# Patient Record
Sex: Female | Born: 1995
Health system: Southern US, Community
[De-identification: ages and names within clinical notes are randomized; demographics above are authoritative.]

## PROBLEM LIST (undated history)

## (undated) DIAGNOSIS — Z973 Presence of spectacles and contact lenses: Secondary | ICD-10-CM

## (undated) DIAGNOSIS — N920 Excessive and frequent menstruation with regular cycle: Secondary | ICD-10-CM

## (undated) HISTORY — PX: NO PAST SURGERIES: SHX2092

## (undated) HISTORY — DX: Presence of spectacles and contact lenses: Z97.3

## (undated) HISTORY — DX: Excessive and frequent menstruation with regular cycle: N92.0

---
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
## 2016-10-01 ENCOUNTER — Ambulatory Visit (INDEPENDENT_AMBULATORY_CARE_PROVIDER_SITE_OTHER): Payer: 59 | Admitting: Family Medicine

## 2016-10-01 ENCOUNTER — Encounter: Payer: Self-pay | Admitting: Family Medicine

## 2016-10-01 ENCOUNTER — Other Ambulatory Visit (HOSPITAL_COMMUNITY)
Admission: RE | Admit: 2016-10-01 | Discharge: 2016-10-01 | Disposition: A | Discharge status: Home / Self Care | Payer: 59 | Source: Ambulatory Visit | Attending: Family Medicine | Admitting: Family Medicine

## 2016-10-01 VITALS — BP 103/67 | HR 67 | Temp 98.4°F | Resp 20 | Ht 63.0 in | Wt 94.5 lb

## 2016-10-01 DIAGNOSIS — N920 Excessive and frequent menstruation with regular cycle: Secondary | ICD-10-CM | POA: Insufficient documentation

## 2016-10-01 DIAGNOSIS — Z0001 Encounter for general adult medical examination with abnormal findings: Secondary | ICD-10-CM | POA: Diagnosis not present

## 2016-10-01 DIAGNOSIS — Z113 Encounter for screening for infections with a predominantly sexual mode of transmission: Secondary | ICD-10-CM | POA: Insufficient documentation

## 2016-10-01 DIAGNOSIS — Z114 Encounter for screening for human immunodeficiency virus [HIV]: Secondary | ICD-10-CM | POA: Insufficient documentation

## 2016-10-01 DIAGNOSIS — Z130 Encounter for screening for diseases of the blood and blood-forming organs and certain disorders involving the immune mechanism: Secondary | ICD-10-CM | POA: Insufficient documentation

## 2016-10-01 DIAGNOSIS — Z681 Body mass index (BMI) 19 or less, adult: Secondary | ICD-10-CM

## 2016-10-01 DIAGNOSIS — Z131 Encounter for screening for diabetes mellitus: Secondary | ICD-10-CM | POA: Diagnosis not present

## 2016-10-01 LAB — CBC WITH DIFFERENTIAL/PLATELET
BASOS PCT: 1.3 % (ref 0.0–3.0)
Basophils Absolute: 0 10*3/uL (ref 0.0–0.1)
EOS ABS: 0 10*3/uL (ref 0.0–0.7)
Eosinophils Relative: 0.5 % (ref 0.0–5.0)
HCT: 40.2 % (ref 36.0–46.0)
HEMOGLOBIN: 12.8 g/dL (ref 12.0–15.0)
LYMPHS PCT: 40.3 % (ref 12.0–46.0)
Lymphs Abs: 1.5 10*3/uL (ref 0.7–4.0)
MCHC: 31.9 g/dL (ref 30.0–36.0)
MCV: 84.8 fl (ref 78.0–100.0)
Monocytes Absolute: 0.3 10*3/uL (ref 0.1–1.0)
Monocytes Relative: 6.8 % (ref 3.0–12.0)
NEUTROS ABS: 1.9 10*3/uL (ref 1.4–7.7)
Neutrophils Relative %: 51.1 % (ref 43.0–77.0)
Platelets: 233 10*3/uL (ref 150.0–400.0)
RBC: 4.73 Mil/uL (ref 3.87–5.11)
RDW: 13.6 % (ref 11.5–14.6)
WBC: 3.7 10*3/uL — ABNORMAL LOW (ref 4.5–10.5)

## 2016-10-01 LAB — COMPREHENSIVE METABOLIC PANEL
ALBUMIN: 4.9 g/dL (ref 3.5–5.2)
ALK PHOS: 53 U/L (ref 39–117)
ALT: 9 U/L (ref 0–35)
AST: 13 U/L (ref 0–37)
BILIRUBIN TOTAL: 0.8 mg/dL (ref 0.2–1.2)
BUN: 12 mg/dL (ref 6–23)
CO2: 27 mEq/L (ref 19–32)
Calcium: 9.8 mg/dL (ref 8.4–10.5)
Chloride: 102 mEq/L (ref 96–112)
Creatinine, Ser: 0.88 mg/dL (ref 0.40–1.20)
GFR: 105.29 mL/min (ref >60.00)
Glucose, Bld: 84 mg/dL (ref 70–99)
Potassium: 3.4 mEq/L — ABNORMAL LOW (ref 3.5–5.1)
SODIUM: 138 meq/L (ref 135–145)
TOTAL PROTEIN: 7.9 g/dL (ref 6.0–8.3)

## 2016-10-01 LAB — TSH: TSH: 1.06 u[IU]/mL (ref 0.35–5.50)

## 2016-10-01 NOTE — Progress Notes (Signed)
Patient ID: Lisa Frank, female  DOB: 09-02-1995, 21 y.o.   MRN: [id number] Patient Care Team    Relationship Specialty Notifications Start End  Ma Hillock, DO PCP - General Family Medicine  10/01/16     Chief Complaint  Patient presents with  . Establish Care  . Annual Exam    Subjective:  Lisa Frank is a 21 y.o.  female present for new patient establishment. All past medical history, surgical history, allergies, family history, immunizations, medications and social history were Obtained and entered in the electronic medical record today. All recent labs, ED visits and hospitalizations within the last year were reviewed.  Well woman exam: Patient is a 21 year old African-American female present for well woman exam today. She reports cycles that occur monthly, last 7 days are heavy throughout all 7 days. She reports she has to change her pad/tampon every 2 hours. She denies dizziness or fatigue. She reports she's not sexually active. She is not on any form of birth control. She denies vaginal irritation, discharge or dyspareunia.Patient's last menstrual period was 10/01/2016.  Health maintenance:  Colonoscopy: No Fhx  Mammogram: start screen at 35-40, FHX Cervical cancer screening: Start screen at 21 Immunizations: tdap UTD 2016, Influenza (encouraged yearly) Infectious disease screening: HIV completed today.  DEXA: N/A Assistive device: None Oxygen use: None Patient has a Dental home. Hospitalizations/ED visits: reviewed if available.   Depression screen [facility] 2/9 10/01/2016  Decreased Interest 0  Down, Depressed, Hopeless 0  PHQ - 2 Score 0   No flowsheet data found.    Current Exercise Habits: The patient does not participate in regular exercise at present Exercise limited by: None identified No flowsheet data found.   Immunization History  Administered Date(s) Administered  . Tdap 04/08/2014    No exam data present  Past Medical History:    Diagnosis Date  . Menorrhagia   . Wears glasses    No Known Allergies Past Surgical History:  Procedure Laterality Date  . NO PAST SURGERIES     Family History  Problem Relation Age of Onset  . Breast cancer Maternal Aunt 37  . HIV/AIDS Paternal Aunt   . Alcohol abuse Paternal Uncle   . Breast cancer Maternal Grandmother 60   Social History   Social History  . Marital status: Single    Spouse name: N/A  . Number of children: N/A  . Years of education: 61   Occupational History  . student    Social History Main Topics  . Smoking status: Never Smoker  . Smokeless tobacco: Never Used  . Alcohol use No  . Drug use: No  . Sexual activity: No   Other Topics Concern  . Not on file   Social History Narrative   Single. Electronics engineer.   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. she lives off campus with her parents.   Drinks caffeine   wears her seatbelt, smoke detector in the home   Feels safe in her relationships.      Allergies as of 10/01/2016   No Known Allergies     Medication List    as of 10/01/2016  1:46 PM   You have not been prescribed any medications.     All past medical history, surgical history, allergies, family history, immunizations andmedications were updated in the EMR today and reviewed under the history and medication portions of their EMR.    No results found for this or any previous visit (from the  past 2160 hour(s)).   ROS: 14 pt review of systems performed and negative (unless mentioned in an HPI)  Objective: BP 103/67 (BP Location: Right Arm, Patient Position: Sitting, Cuff Size: Small)   Pulse 67   Temp 98.4 F (36.9 C)   Resp 20   Ht '5\' 3"'  (1.6 m)   Wt 94 lb 8 oz (42.9 kg)   LMP 10/01/2016   SpO2 100%   BMI 16.74 kg/m  Gen: Afebrile. No acute distress. Nontoxic in appearance, well-developed, well-nourished,  Very pleasant African-American female, looks younger than stated age. Thin. HENT: AT. [place]. Bilateral TM visualized and  normal in appearance, normal external auditory canal. MMM, no oral lesions, adequate dentition. Bilateral nares within normal limits. Throat without erythema, ulcerations or exudates. No Cough on exam, no hoarseness on exam. Eyes:Pupils Equal Round Reactive to light, Extraocular movements intact,  Conjunctiva without redness, discharge or icterus. Neck/lymp/endocrine: Supple, no lymphadenopathy, no thyromegaly CV: RRR no murmur, no edema, +2/4 P posterior tibialis pulses. No carotid bruits. No JVD. Chest: CTAB, no wheeze, rhonchi or crackles. Normal Respiratory effort. Good Air movement. Abd: Soft. Flat. NTND. BS present. No Masses palpated. No hepatosplenomegaly. No rebound tenderness or guarding. Skin: No rashes, purpura or petechiae. Warm and well-perfused. Skin intact. Neuro/Msk:  Normal gait. PERLA. EOMi. Alert. Oriented x3.  Cranial nerves II through XII intact. Muscle strength 5/5 upper/lower extremity. DTRs equal bilaterally. Psych: Normal affect, dress and demeanor. Normal speech. Normal thought content and judgment.   Assessment/plan: Lisa Frank is a 21 y.o. female present for est/CPE Screening for iron deficiency anemia - Patient with menorrhagia history - CBC w/Diff Encounter for general adult medical examination with abnormal findings Patient was encouraged to exercise greater than 150 minutes a week. Patient was encouraged to choose a diet filled with fresh fruits and vegetables, and lean meats. AVS provided to patient today for education/recommendation on gender specific health and safety maintenance. - Comp Met (CMET) - Up-to-date with all screenings and immunizations after today. Cervical cancer screening will start next year at age 89. - Encouraged yearly flu shot. Screen for STD (sexually transmitted disease) - Urine cytology ancillary only BMI less than 19,adult - Patient states she's always been very small. Her BMI today is 16. - TSH Encounter for screening for  HIV - Patient agreeable to testing today - HIV antibody (with reflex) Menorrhagia with regular cycle - could consider BCP if she desires.  - TSH and CBC   Return in about 1 year (around 10/01/2017) for CPE.   Note is dictated utilizing voice recognition software. Although note has been proof read prior to signing, occasional typographical errors still can be missed. If any questions arise, please do not hesitate to call for verification.  Electronically signed by: Howard Pouch, DO [place]

## 2016-10-01 NOTE — Patient Instructions (Signed)
Health Maintenance, Female Adopting a healthy lifestyle and getting preventive care can go a long way to promote health and wellness. Talk with your health care provider about what schedule of regular examinations is right for you. This is a good chance for you to check in with your provider about disease prevention and staying healthy. In between checkups, there are plenty of things you can do on your own. Experts have done a lot of research about which lifestyle changes and preventive measures are most likely to keep you healthy. Ask your health care provider for more information. Weight and diet Eat a healthy diet  Be sure to include plenty of vegetables, fruits, low-fat dairy products, and lean protein.  Do not eat a lot of foods high in solid fats, added sugars, or salt.  Get regular exercise. This is one of the most important things you can do for your health. ? Most adults should exercise for at least 150 minutes each week. The exercise should increase your heart rate and make you sweat (moderate-intensity exercise). ? Most adults should also do strengthening exercises at least twice a week. This is in addition to the moderate-intensity exercise.  Maintain a healthy weight  Body mass index (BMI) is a measurement that can be used to identify possible weight problems. It estimates body fat based on height and weight. Your health care provider can help determine your BMI and help you achieve or maintain a healthy weight.  For females 20 years of age and older: ? A BMI below 18.5 is considered underweight. ? A BMI of 18.5 to 24.9 is normal. ? A BMI of 25 to 29.9 is considered overweight. ? A BMI of 30 and above is considered obese.  Watch levels of cholesterol and blood lipids  You should start having your blood tested for lipids and cholesterol at 20 years of age, then have this test every 5 years.  You may need to have your cholesterol levels checked more often if: ? Your lipid or  cholesterol levels are high. ? You are older than 20 years of age. ? You are at high risk for heart disease.  Cancer screening Lung Cancer  Lung cancer screening is recommended for adults 55-80 years old who are at high risk for lung cancer because of a history of smoking.  A yearly low-dose CT scan of the lungs is recommended for people who: ? Currently smoke. ? Have quit within the past 15 years. ? Have at least a 30-pack-year history of smoking. A pack year is smoking an average of one pack of cigarettes a day for 1 year.  Yearly screening should continue until it has been 15 years since you quit.  Yearly screening should stop if you develop a health problem that would prevent you from having lung cancer treatment.  Breast Cancer  Practice breast self-awareness. This means understanding how your breasts normally appear and feel.  It also means doing regular breast self-exams. Let your health care provider know about any changes, no matter how small.  If you are in your 20s or 30s, you should have a clinical breast exam (CBE) by a health care provider every 1-3 years as part of a regular health exam.  If you are 40 or older, have a CBE every year. Also consider having a breast X-ray (mammogram) every year.  If you have a family history of breast cancer, talk to your health care provider about genetic screening.  If you are at high risk   for breast cancer, talk to your health care provider about having an MRI and a mammogram every year.  Breast cancer gene (BRCA) assessment is recommended for women who have family members with BRCA-related cancers. BRCA-related cancers include: ? Breast. ? Ovarian. ? Tubal. ? Peritoneal cancers.  Results of the assessment will determine the need for genetic counseling and BRCA1 and BRCA2 testing.  Cervical Cancer Your health care provider may recommend that you be screened regularly for cancer of the pelvic organs (ovaries, uterus, and  vagina). This screening involves a pelvic examination, including checking for microscopic changes to the surface of your cervix (Pap test). You may be encouraged to have this screening done every 3 years, beginning at age 22.  For women ages 56-65, health care providers may recommend pelvic exams and Pap testing every 3 years, or they may recommend the Pap and pelvic exam, combined with testing for human papilloma virus (HPV), every 5 years. Some types of HPV increase your risk of cervical cancer. Testing for HPV may also be done on women of any age with unclear Pap test results.  Other health care providers may not recommend any screening for nonpregnant women who are considered low risk for pelvic cancer and who do not have symptoms. Ask your health care provider if a screening pelvic exam is right for you.  If you have had past treatment for cervical cancer or a condition that could lead to cancer, you need Pap tests and screening for cancer for at least 20 years after your treatment. If Pap tests have been discontinued, your risk factors (such as having a new sexual partner) need to be reassessed to determine if screening should resume. Some women have medical problems that increase the chance of getting cervical cancer. In these cases, your health care provider may recommend more frequent screening and Pap tests.  Colorectal Cancer  This type of cancer can be detected and often prevented.  Routine colorectal cancer screening usually begins at 20 years of age and continues through 20 years of age.  Your health care provider may recommend screening at an earlier age if you have risk factors for colon cancer.  Your health care provider may also recommend using home test kits to check for hidden blood in the stool.  A small camera at the end of a tube can be used to examine your colon directly (sigmoidoscopy or colonoscopy). This is done to check for the earliest forms of colorectal  cancer.  Routine screening usually begins at age 33.  Direct examination of the colon should be repeated every 5-10 years through 20 years of age. However, you may need to be screened more often if early forms of precancerous polyps or small growths are found.  Skin Cancer  Check your skin from head to toe regularly.  Tell your health care provider about any new moles or changes in moles, especially if there is a change in a mole's shape or color.  Also tell your health care provider if you have a mole that is larger than the size of a pencil eraser.  Always use sunscreen. Apply sunscreen liberally and repeatedly throughout the day.  Protect yourself by wearing long sleeves, pants, a wide-brimmed hat, and sunglasses whenever you are outside.  Heart disease, diabetes, and high blood pressure  High blood pressure causes heart disease and increases the risk of stroke. High blood pressure is more likely to develop in: ? People who have blood pressure in the high end of  the normal range (130-139/85-89 mm Hg). ? People who are overweight or obese. ? People who are African American.  If you are 21-29 years of age, have your blood pressure checked every 3-5 years. If you are 3 years of age or older, have your blood pressure checked every year. You should have your blood pressure measured twice-once when you are at a hospital or clinic, and once when you are not at a hospital or clinic. Record the average of the two measurements. To check your blood pressure when you are not at a hospital or clinic, you can use: ? An automated blood pressure machine at a pharmacy. ? A home blood pressure monitor.  If you are between 17 years and 37 years old, ask your health care provider if you should take aspirin to prevent strokes.  Have regular diabetes screenings. This involves taking a blood sample to check your fasting blood sugar level. ? If you are at a normal weight and have a low risk for diabetes,  have this test once every three years after 20 years of age. ? If you are overweight and have a high risk for diabetes, consider being tested at a younger age or more often. Preventing infection Hepatitis B  If you have a higher risk for hepatitis B, you should be screened for this virus. You are considered at high risk for hepatitis B if: ? You were born in a country where hepatitis B is common. Ask your health care provider which countries are considered high risk. ? Your parents were born in a high-risk country, and you have not been immunized against hepatitis B (hepatitis B vaccine). ? You have HIV or AIDS. ? You use needles to inject street drugs. ? You live with someone who has hepatitis B. ? You have had sex with someone who has hepatitis B. ? You get hemodialysis treatment. ? You take certain medicines for conditions, including cancer, organ transplantation, and autoimmune conditions.  Hepatitis C  Blood testing is recommended for: ? Everyone born from [birth year] through 1964. ? Anyone with known risk factors for hepatitis C.  Sexually transmitted infections (STIs)  You should be screened for sexually transmitted infections (STIs) including gonorrhea and chlamydia if: ? You are sexually active and are younger than 20 years of age. ? You are older than 20 years of age and your health care provider tells you that you are at risk for this type of infection. ? Your sexual activity has changed since you were last screened and you are at an increased risk for chlamydia or gonorrhea. Ask your health care provider if you are at risk.  If you do not have HIV, but are at risk, it may be recommended that you take a prescription medicine daily to prevent HIV infection. This is called pre-exposure prophylaxis (PrEP). You are considered at risk if: ? You are sexually active and do not regularly use condoms or know the HIV status of your partner(s). ? You take drugs by injection. ? You are  sexually active with a partner who has HIV.  Talk with your health care provider about whether you are at high risk of being infected with HIV. If you choose to begin PrEP, you should first be tested for HIV. You should then be tested every 3 months for as long as you are taking PrEP. Pregnancy  If you are premenopausal and you may become pregnant, ask your health care provider about preconception counseling.  If you may become  pregnant, take 400 to 800 micrograms (mcg) of folic acid every day.  If you want to prevent pregnancy, talk to your health care provider about birth control (contraception). Osteoporosis and menopause  Osteoporosis is a disease in which the bones lose minerals and strength with aging. This can result in serious bone fractures. Your risk for osteoporosis can be identified using a bone density scan.  If you are 27 years of age or older, or if you are at risk for osteoporosis and fractures, ask your health care provider if you should be screened.  Ask your health care provider whether you should take a calcium or vitamin D supplement to lower your risk for osteoporosis.  Menopause may have certain physical symptoms and risks.  Hormone replacement therapy may reduce some of these symptoms and risks. Talk to your health care provider about whether hormone replacement therapy is right for you. Follow these instructions at home:  Schedule regular health, dental, and eye exams.  Stay current with your immunizations.  Do not use any tobacco products including cigarettes, chewing tobacco, or electronic cigarettes.  If you are pregnant, do not drink alcohol.  If you are breastfeeding, limit how much and how often you drink alcohol.  Limit alcohol intake to no more than 1 drink per day for nonpregnant women. One drink equals 12 ounces of beer, 5 ounces of wine, or 1 ounces of hard liquor.  Do not use street drugs.  Do not share needles.  Ask your health care  provider for help if you need support or information about quitting drugs.  Tell your health care provider if you often feel depressed.  Tell your health care provider if you have ever been abused or do not feel safe at home. This information is not intended to replace advice given to you by your health care provider. Make sure you discuss any questions you have with your health care provider. Document Released: 09/07/2010 Document Revised: 07/31/2015 Document Reviewed: 11/26/2014 Elsevier Interactive Patient Education  2018 [employer].   Please help [place] help you:  We are honored you have chosen [facility] for your Primary Care home. Below you will find basic instructions that you may need to access in the future. Please help [place] help you by reading the instructions, which cover many of the frequent questions we experience.   Prescription refills and request:  -In order to allow more efficient response time, please call your pharmacy for all refills. They will forward the request electronically to [place]. This allows for the quickest possible response. Request left on a nurse line can take longer to refill, since these are checked as time allows between office patients and other phone calls.  - refill request can take up to 3-5 working days to complete.  - If request is sent electronically and request is appropiate, it is usually completed in 1-2 business days.  - all patients will need to be seen routinely for all chronic medical conditions requiring prescription medications (see follow-up below). If you are overdue for follow up on your condition, you will be asked to make an appointment and we will call in enough medication to cover you until your appointment (up to 30 days).  - all controlled substances will require a face to face visit to request/refill.  - if you desire your prescriptions to go through a new pharmacy, and have an active script at original pharmacy, you will need to call  your pharmacy and have scripts transferred to  new pharmacy. This is completed between the pharmacy locations and not by your provider.    Results: If any images or labs were ordered, it can take up to 1 week to get results depending on the test ordered and the lab/facility running and resulting the test. - Normal or stable results, which do not need further discussion, may be released to your mychart immediately with attached note to you. A call may not be generated for normal results. Please make certain to sign up for mychart. If you have questions on how to activate your mychart you can call the front office.  - If your results need further discussion, our office will attempt to contact you via phone, and if unable to reach you after 2 attempts, we will release your abnormal result to your mychart with instructions.  - All results will be automatically released in mychart after 1 week.  - Your provider will provide you with explanation and instruction on all relevant material in your results. Please keep in mind, results and labs may appear confusing or abnormal to the untrained eye, but it does not mean they are actually abnormal for you personally. If you have any questions about your results that are not covered, or you desire more detailed explanation than what was provided, you should make an appointment with your provider to do so.   Our office handles many outgoing and incoming calls daily. If we have not contacted you within 1 week about your results, please check your mychart to see if there is a message first and if not, then contact our office.  In helping with this matter, you help decrease call volume, and therefore allow [place] to be able to respond to patients needs more efficiently.   Acute office visits (sick visit):  An acute visit is intended for a new problem and are scheduled in shorter time slots to allow schedule openings for patients with new problems. This is the appropriate visit  to discuss a new problem. In order to provide you with excellent quality medical care with proper time for you to explain your problem, have an exam and receive treatment with instructions, these appointments should be limited to one new problem per visit. If you experience a new problem, in which you desire to be addressed, please make an acute office visit, we save openings on the schedule to accommodate you. Please do not save your new problem for any other type of visit, let us take care of it properly and quickly for you.   Follow up visits:  Depending on your condition(s) your provider will need to see you routinely in order to provide you with quality care and prescribe medication(s). Most chronic conditions (Example: hypertension, Diabetes, depression/anxiety... etc), require visits a couple times a year. Your provider will instruct you on proper follow up for your personal medical conditions and history. Please make certain to make follow up appointments for your condition as instructed. Failing to do so could result in lapse in your medication treatment/refills. If you request a refill, and are overdue to be seen on a condition, we will always provide you with a 30 day script (once) to allow you time to schedule.    Medicare wellness (well visit): - we have a wonderful Nurse Maudie Mercury), that will meet with you and provide you will yearly medicare wellness visits. These visits should occur yearly (can not be scheduled less than 1 calendar year apart) and cover preventive health, immunizations, advance directives and  screenings you are entitled to yearly through your medicare benefits. Do not miss out on your entitled benefits, this is when medicare will pay for these benefits to be ordered for you.  These are strongly encouraged by your provider and is the appropriate type of visit to make certain you are up to date with all preventive health benefits. If you have not had your medicare wellness exam in  the last 12 months, please make certain to schedule one by calling the office and schedule your medicare wellness with Maudie Mercury as soon as possible.   Yearly physical (well visit):  - Adults are recommended to be seen yearly for physicals. Check with your insurance and date of your last physical, most insurances require one calendar year between physicals. Physicals include all preventive health topics, screenings, medical exam and labs that are appropriate for gender/age and history. You may have fasting labs needed at this visit. This is a well visit (not a sick visit), new problems should not be covered during this visit (see acute visit).  - Pediatric patients are seen more frequently when they are younger. Your provider will advise you on well child visit timing that is appropriate for your their age. - This is not a medicare wellness visit. Medicare wellness exams do not have an exam portion to the visit. Some medicare companies allow for a physical, some do not allow a yearly physical. If your medicare allows a yearly physical you can schedule the medicare wellness with our nurse Maudie Mercury and have your physical with your provider after, on the same day. Please check with insurance for your full benefits.   Late Policy/No Shows:  - all new patients should arrive 15-30 minutes earlier than appointment to allow [place] time  to  obtain all personal demographics,  insurance information and for you to complete office paperwork. - All established patients should arrive 10-15 minutes earlier than appointment time to update all information and be checked in .  - In our best efforts to run on time, if you are late for your appointment you will be asked to either reschedule or if able, we will work you back into the schedule. There will be a wait time to work you back in the schedule,  depending on availability.  - If you are unable to make it to your appointment as scheduled, please call 24 hours ahead of time to allow [place]  to fill the time slot with someone else who needs to be seen. If you do not cancel your appointment ahead of time, you may be charged a no show fee.

## 2016-10-02 LAB — HIV ANTIBODY (ROUTINE TESTING W REFLEX): HIV: NONREACTIVE

## 2016-10-04 LAB — URINE CYTOLOGY ANCILLARY ONLY
CHLAMYDIA, DNA PROBE: NEGATIVE
NEISSERIA GONORRHEA: NEGATIVE

## 2016-10-05 ENCOUNTER — Telehealth: Payer: Self-pay | Admitting: *Deleted

## 2016-10-05 NOTE — Telephone Encounter (Signed)
Patient called and is inquiring about her lab results. Patient is requesting a call back. Her contact number is [phone]. Please advise. Thank you

## 2016-10-05 NOTE — Telephone Encounter (Signed)
Patient notified that PCP is out of the office this week and that our other physician would contact patient if labs were abnormal.  Dr. Claiborne BillingsKuneff will go over labs when she returns and we will call her as soon as results are released.

## 2016-10-11 ENCOUNTER — Telehealth: Payer: Self-pay | Admitting: Family Medicine

## 2016-10-11 ENCOUNTER — Ambulatory Visit (INDEPENDENT_AMBULATORY_CARE_PROVIDER_SITE_OTHER): Payer: 59 | Admitting: Family Medicine

## 2016-10-11 ENCOUNTER — Encounter: Payer: Self-pay | Admitting: Family Medicine

## 2016-10-11 VITALS — BP 105/69 | HR 72 | Temp 98.3°F | Resp 18 | Ht 63.0 in | Wt 97.8 lb

## 2016-10-11 DIAGNOSIS — Z681 Body mass index (BMI) 19 or less, adult: Secondary | ICD-10-CM | POA: Diagnosis not present

## 2016-10-11 DIAGNOSIS — R627 Adult failure to thrive: Secondary | ICD-10-CM | POA: Diagnosis not present

## 2016-10-11 NOTE — Patient Instructions (Signed)
Goal is 3000- 3300 calories a day.  Try to break up meals into 5 smaller meals (which can be high calorie boost or carnation breakfast)  High calorie protein, meats, nuts, olive oil for meals and snacks.    Calorie calculator .net --> weigh once a week, and put new numbers into calculator. --> change calorie goal by new suggestions.     High-Protein and High-Calorie Diet Eating high-protein and high-calorie foods can help you to gain weight, heal after an injury, and recover after an illness or surgery. What is my plan? The specific amount of daily protein and calories you need depends on:  Your body weight.  The reason this diet is recommended for you.  Generally, a high-protein, high-calorie diet involves:  Eating 250-500 extra calories each day.  Making sure that 10-35% of your daily calories come from protein.  Talk to your health care provider about how much protein and how many calories you need each day. Follow the diet as directed by your health care provider. What do I need to know about this diet?  Ask your health care provider if you should take a nutritional supplement.  Try to eat six small meals each day instead of three large meals.  Eat a balanced diet, including one food that is high in protein at each meal.  Keep nutritious snacks handy, such as nuts, trail mixes, dried fruit, and yogurt.  If you have kidney disease or diabetes, eating too much protein may put extra stress on your kidneys. Talk to your health care provider if you have either of those conditions. What are some high-protein foods? Grains Quinoa. Bulgur wheat. Vegetables Soybeans. Peas. Meats and Other Protein Sources Beef, pork, and poultry. Fish and seafood. Eggs. Tofu. Textured vegetable protein (TVP). Peanut butter. Nuts and seeds. Dried beans. Protein powders. Dairy Whole milk. Whole-milk yogurt. Powdered milk. Cheese. [employer]. Eggnog. Beverages High-protein supplement drinks.  Soy milk. Other Protein bars. The items listed above may not be a complete list of recommended foods or beverages. Contact your dietitian for more options. What are some high-calorie foods? Grains Pasta. Quick breads. Muffins. Pancakes. Ready-to-eat cereal. Vegetables Vegetables cooked in oil or butter. Fried potatoes. Fruits Dried fruit. Fruit leather. Canned fruit in syrup. Fruit juice. Avocados. Meats and Other Protein Sources Peanut butter. Nuts and seeds. Dairy Heavy cream. Whipped cream. Cream cheese. Sour cream. Ice cream. Custard. Pudding. Beverages Meal-replacement beverages. Nutrition shakes. Fruit juice. Sugar-sweetened soft drinks. Condiments Salad dressing. Mayonnaise. Alfredo sauce. Fruit preserves or jelly. Honey. Syrup. Sweets/Desserts Cake. Cookies. Pie. Pastries. Candy bars. Chocolate. Fats and Oils Butter or margarine. Oil. Gravy. Other Meal-replacement bars. The items listed above may not be a complete list of recommended foods or beverages. Contact your dietitian for more options. What are some tips for including high-protein and high-calorie foods in my diet?  Add whole milk, half-and-half, or heavy cream to cereal, pudding, soup, or hot cocoa.  Add whole milk to instant breakfast drinks.  Add peanut butter to oatmeal or smoothies.  Add powdered milk to baked goods, smoothies, or milkshakes.  Add powdered milk, cream, or butter to mashed potatoes.  Add cheese to cooked vegetables.  Make whole-milk yogurt parfaits. Top them with granola, fruit, or nuts.  Add cottage cheese to your fruit.  Add avocados, cheese, or both to sandwiches or salads.  Add meat, poultry, or seafood to rice, pasta, casseroles, salads, and soups.  Use mayonnaise when making egg salad, chicken salad, or tuna salad.  Use  peanut butter as a topping for pretzels, celery, or crackers.  Add beans to casseroles, dips, and spreads.  Add pureed beans to sauces and  soups.  Replace calorie-free drinks with calorie-containing drinks, such as milk and fruit juice. This information is not intended to replace advice given to you by your health care provider. Make sure you discuss any questions you have with your health care provider. Document Released: 02/22/2005 Document Revised: 07/31/2015 Document Reviewed: 08/07/2013 Elsevier Interactive Patient Education  [employer]2018 Elsevier Inc.

## 2016-10-11 NOTE — Progress Notes (Signed)
Lisa Frank , 05/31/1995, 21 y.o., female MRN: [id number] Patient Care Team    Relationship Specialty Notifications Start End  Natalia LeatherwoodKuneff, Renee A, DO PCP - General Family Medicine  10/01/16     Chief Complaint  Patient presents with  . weight management    patient wants to gain weight     Subjective: Pt presents for an OV with complaints of Inability to gain weight. She states she's always been small, and no matter what she has tried she cannot gain weight. She states she has tried tracking her calories trying to take in 2000 cal a day, but was only getting 1700 cal a day. She reports she just gets too full and does not want to eat. She eats one large meal a day, and have snacks consisting of chips or candy. She does not have a formal exercise regimen. She reports she walks a great deal. She had a recent physical with labs, that were basically unremarkable with the exception of just barely mildly low potassium, CBC, CMP and TSH normal. HIV, gonorrhea and Chlamydia negative. She denies any weight loss, night sweats, fever, chills or change in bowel habits.  Depression screen PHQ 2/9 10/01/2016  Decreased Interest 0  Down, Depressed, Hopeless 0  PHQ - 2 Score 0    No Known Allergies Social History  Substance Use Topics  . Smoking status: Never Smoker  . Smokeless tobacco: Never Used  . Alcohol use No   Past Medical History:  Diagnosis Date  . Menorrhagia   . Wears glasses    Past Surgical History:  Procedure Laterality Date  . NO PAST SURGERIES     Family History  Problem Relation Age of Onset  . Breast cancer Maternal Aunt 50  . HIV/AIDS Paternal Aunt   . Alcohol abuse Paternal Uncle   . Breast cancer Maternal Grandmother 60   Allergies as of 10/11/2016   No Known Allergies     Medication List    as of 10/11/2016  3:23 PM   You have not been prescribed any medications.     All past medical history, surgical history, allergies, family history, immunizations  andmedications were updated in the EMR today and reviewed under the history and medication portions of their EMR.     ROS: Negative, with the exception of above mentioned in HPI   Objective:  BP 105/69 (BP Location: Left Arm, Patient Position: Sitting, Cuff Size: Small)   Pulse 72   Temp 98.3 F (36.8 C)   Resp 18   Ht 5\' 3"  (1.6 m)   Wt 97 lb 12.8 oz (44.4 kg)   LMP 10/01/2016   SpO2 99%   BMI 17.32 kg/m  Body mass index is 17.32 kg/m. Gen: Afebrile. No acute distress. Nontoxic in appearance, well developed, well nourished. African-American female. Small frame. HENT: AT. [place]. MMM Eyes:Pupils Equal Round Reactive to light, Extraocular movements intact,  Conjunctiva without redness, discharge or icterus. CV: RRR  Chest: CTAB, no wheeze or crackles.  Abd: Soft.  NTND. BS present Skin: No rashes, purpura or petechiae.  Psych: Normal affect, dress and demeanor. Normal speech. Normal thought content and judgment.  No exam data present No results found. No results found for this or any previous visit (from the past 24 hour(s)).  Assessment/Plan: Lisa Frank is a 21 y.o. female present for OV for  BMI less than 19,adult/poor weight gain - Patient is a small framed healthy African-American female. She appears healthy and fit.  Normal thyroid function, no signs of malabsorption. She does desire to gain some weight, and we discussed healthy BMI and healthy weight gain in great detail today. - She already has a application on her phone that will help her count her calories. She was provided with the website to a calorie calculate her to help guide her through the process on how many calories she should consume in a day to gain weight in a healthy manner. We decided on a goal of approximately 3000 cal a day, and she was given aVF education and instruction on how to obtain healthy calories. - She currently is eating 1 large meal a day, I encouraged her to eat 3-5 smaller meals a day.  Supplement between meals if necessary with high calorie boost. She is worried about having the time when she returns to classes to eat this many times a day, the high calorie boost or [employer] would be ideal since she can take this to class with her. Discussed using healthy lean meats and increase proteins for the extra calories. AVS education surrounding different types of food that is ideal for this was provided to her. - We discussed follow-up, if she would like she can follow-up in about 4 weeks to see how she is doing if she would like.   Reviewed expectations re: course of current medical issues.  Discussed self-management of symptoms.  Outlined signs and symptoms indicating need for more acute intervention.  Patient verbalized understanding and all questions were answered.  Patient received an After-Visit Summary.    No orders of the defined types were placed in this encounter.  > 25 minutes spent with patient, >50% of time spent face to face counseling    Note is dictated utilizing voice recognition software. Although note has been proof read prior to signing, occasional typographical errors still can be missed. If any questions arise, please do not hesitate to call for verification.   electronically signed by:  Felix Pacini, DO  [facility] [place]

## 2016-10-11 NOTE — Telephone Encounter (Signed)
Please call pt: - all labs are normal. - her potassium is mildly low, she could benefit from taking a daily multivitamin and/or increase potassium rich foods.

## 2016-10-11 NOTE — Telephone Encounter (Signed)
Detailed message left on voice mail. Okay per DPR. 

## 2016-10-14 DIAGNOSIS — R627 Adult failure to thrive: Secondary | ICD-10-CM | POA: Insufficient documentation

## 2017-10-05 ENCOUNTER — Encounter: Payer: Self-pay | Admitting: Family Medicine

## 2017-10-05 ENCOUNTER — Ambulatory Visit (INDEPENDENT_AMBULATORY_CARE_PROVIDER_SITE_OTHER): Payer: 59 | Admitting: Family Medicine

## 2017-10-05 ENCOUNTER — Other Ambulatory Visit (HOSPITAL_COMMUNITY)
Admission: RE | Admit: 2017-10-05 | Discharge: 2017-10-05 | Disposition: A | Discharge status: Home / Self Care | Payer: 59 | Source: Ambulatory Visit | Attending: Family Medicine | Admitting: Family Medicine

## 2017-10-05 VITALS — BP 101/67 | HR 75 | Temp 98.4°F | Resp 20 | Ht 63.0 in | Wt 97.6 lb

## 2017-10-05 DIAGNOSIS — Z0001 Encounter for general adult medical examination with abnormal findings: Secondary | ICD-10-CM | POA: Diagnosis not present

## 2017-10-05 DIAGNOSIS — Z130 Encounter for screening for diseases of the blood and blood-forming organs and certain disorders involving the immune mechanism: Secondary | ICD-10-CM | POA: Diagnosis not present

## 2017-10-05 DIAGNOSIS — Z01419 Encounter for gynecological examination (general) (routine) without abnormal findings: Secondary | ICD-10-CM | POA: Diagnosis not present

## 2017-10-05 DIAGNOSIS — Z114 Encounter for screening for human immunodeficiency virus [HIV]: Secondary | ICD-10-CM

## 2017-10-05 DIAGNOSIS — Z30011 Encounter for initial prescription of contraceptive pills: Secondary | ICD-10-CM

## 2017-10-05 DIAGNOSIS — Z681 Body mass index (BMI) 19 or less, adult: Secondary | ICD-10-CM | POA: Diagnosis not present

## 2017-10-05 LAB — CBC WITH DIFFERENTIAL/PLATELET
Basophils Absolute: 0 10*3/uL (ref 0.0–0.1)
Basophils Relative: 1.1 % (ref 0.0–3.0)
EOS PCT: 1 % (ref 0.0–5.0)
Eosinophils Absolute: 0 10*3/uL (ref 0.0–0.7)
HCT: 38.3 % (ref 36.0–46.0)
Hemoglobin: 12.5 g/dL (ref 12.0–15.0)
Lymphocytes Relative: 41.7 % (ref 12.0–46.0)
Lymphs Abs: 1.9 10*3/uL (ref 0.7–4.0)
MCHC: 32.6 g/dL (ref 30.0–36.0)
MCV: 83.8 fl (ref 78.0–100.0)
MONO ABS: 0.4 10*3/uL (ref 0.1–1.0)
Monocytes Relative: 7.8 % (ref 3.0–12.0)
Neutro Abs: 2.2 10*3/uL (ref 1.4–7.7)
Neutrophils Relative %: 48.4 % (ref 43.0–77.0)
PLATELETS: 250 10*3/uL (ref 150.0–400.0)
RBC: 4.57 Mil/uL (ref 3.87–5.11)
RDW: 13.9 % (ref 11.5–15.5)
WBC: 4.5 10*3/uL (ref 4.0–10.5)

## 2017-10-05 MED ORDER — NORGESTIMATE-ETH ESTRADIOL 0.25-35 MG-MCG PO TABS: 1.0000 | ORAL_TABLET | Freq: Every day | ORAL | 11 refills | Status: DC

## 2017-10-05 NOTE — Progress Notes (Signed)
Patient ID: Lisa Frank, female  DOB: 04/12/1995, 22 y.o.   MRN: [id number] Patient Care Team    Relationship Specialty Notifications Start End  Natalia LeatherwoodKuneff, Jahziel Sinn A, DO PCP - General Family Medicine  10/01/16     Chief Complaint  Patient presents with  . Annual Exam  . Gynecologic Exam    Subjective:  Lisa Frank is a 22 y.o.  Female  present for CPE w/ PAP. All past medical history, surgical history, allergies, family history, immunizations, medications and social history were updated in the electronic medical record today. All recent labs, ED visits and hospitalizations within the last year were reviewed.  Well woman exam: Patient is a 22 year old African-American female present for well woman exam today. She reports cycles are ~28-31 days, last 7 days and are not as heavy as before, but steady flow all 7 days. She has been sexually active, but not at this time. Patient's last menstrual period was 09/09/2017.   Health maintenance: updated 10/05/17 Colonoscopy: No Fhx  Mammogram: start screen at 35-40, FHX; SBE instructed today and encouraged the week after cycles.  Cervical cancer screening: Start screen at 6221- completed today Immunizations: tdap UTD 2016, Influenza (encouraged yearly) Infectious disease screening: HIV completed 10/01/2016 DEXA: N/A Assistive device: None Oxygen use: None Patient has a Dental home. Hospitalizations/ED visits: reviewed if available   [facility] 10/05/2017 10/01/2016  Decreased Interest 0 0  Down, Depressed, Hopeless 0 0  PHQ - 2 Score 0 0   No flowsheet data found.   Current Exercise Habits: The patient does not participate in regular exercise at present Exercise limited by: None identified   Immunization History  Administered Date(s) Administered  . Tdap 04/08/2014     Past Medical History:  Diagnosis Date  . Menorrhagia   . Wears glasses    No Known Allergies Past Surgical History:  Procedure  Laterality Date  . NO PAST SURGERIES     Family History  Problem Relation Age of Onset  . Breast cancer Maternal Aunt 50  . HIV/AIDS Paternal Aunt   . Alcohol abuse Paternal Uncle   . Breast cancer Maternal Grandmother 2060   Social History   Socioeconomic History  . Marital status: Single    Spouse name: Not on file  . Number of children: Not on file  . Years of education: 3914  . Highest education level: Not on file  Occupational History  . Occupation: Consulting civil engineerstudent  Social Needs  . Financial resource strain: Not on file  . Food insecurity:    Worry: Not on file    Inability: Not on file  . Transportation needs:    Medical: Not on file    Non-medical: Not on file  Tobacco Use  . Smoking status: Never Smoker  . Smokeless tobacco: Never Used  Substance and Sexual Activity  . Alcohol use: No  . Drug use: No  . Sexual activity: Not Currently    Birth control/protection: Condom  Lifestyle  . Physical activity:    Days per week: Not on file    Minutes per session: Not on file  . Stress: Not on file  Relationships  . Social connections:    Talks on phone: Not on file    Gets together: Not on file    Attends religious service: Not on file    Active member of club or organization: Not on file    Attends meetings of clubs or organizations: Not on file  Relationship status: Not on file  . Intimate partner violence:    Fear of current or ex partner: Not on file    Emotionally abused: Not on file    Physically abused: Not on file    Forced sexual activity: Not on file  Other Topics Concern  . Not on file  Social History Narrative   Single. Archivist.   She is studying nursing at [employer]. she lives off campus with her parents.   Drinks caffeine   wears her seatbelt, smoke detector in the home   Feels safe in her relationships.   Allergies as of 10/05/2017   No Known Allergies     Medication List        Accurate as of 10/05/17 10:56 AM. Always use your most recent  med list.          norgestimate-ethinyl estradiol 0.25-35 MG-MCG tablet Commonly known as:  ORTHO-CYCLEN,SPRINTEC,PREVIFEM Take 1 tablet by mouth daily.       All past medical history, surgical history, allergies, family history, immunizations andmedications were updated in the EMR today and reviewed under the history and medication portions of their EMR.     No results found for this or any previous visit (from the past 2160 hour(s)).  No results found.   ROS: 14 pt review of systems performed and negative (unless mentioned in an HPI)  Objective: BP 101/67 (BP Location: Right Arm, Patient Position: Sitting, Cuff Size: Small)   Pulse 75   Temp 98.4 F (36.9 C)   Resp 20   Ht 5\' 3"  (1.6 m)   Wt 97 lb 9.6 oz (44.3 kg)   LMP 09/09/2017   SpO2 97%   BMI 17.29 kg/m  Gen: Afebrile. No acute distress. Nontoxic in appearance, well-developed, well-nourished,  Very pleasant thin AAF. HENT: AT. [place]. Bilateral TM visualized and normal in appearance, normal external auditory canal. MMM, no oral lesions, adequate dentition. Bilateral nares within normal limits. Throat without erythema, ulcerations or exudates. no Cough on exam, no hoarseness on exam. Eyes:Pupils Equal Round Reactive to light, Extraocular movements intact,  Conjunctiva without redness, discharge or icterus. Neck/lymp/endocrine: Supple,no lymphadenopathy, no thyromegaly CV: RRR no murmur, no edema, +2/4 P posterior tibialis pulses. no carotid bruits. No JVD. Chest: CTAB, no wheeze, rhonchi or crackles. normal Respiratory effort. good Air movement. Abd: Soft. flat. NTND. BS present. no Masses palpated. No hepatosplenomegaly. No rebound tenderness or guarding. Skin: no rashes, purpura or petechiae. Warm and well-perfused. Skin intact. Neuro/Msk:  Normal gait. PERLA. EOMi. Alert. Oriented x3.  Cranial nerves II through XII intact. Muscle strength 5/5 upper/lower extremity. DTRs equal bilaterally. Psych: Normal affect, dress and  demeanor. Normal speech. Normal thought content and judgment. Breasts: breasts appear normal, symmetrical, no tenderness on exam, no suspicious masses, no skin or nipple changes or axillary nodes. GYN:  External genitalia within normal limits, normal hair distribution, no lesions. Urethral meatus normal, no lesions. Vaginal mucosa pink, moist, normal rugae, no lesions. No cystocele or rectocele. cervix without lesions, no discharge. Bimanual exam revealed normal uterus.  No bladder/suprapubic fullness, masses or tenderness. No cervical motion tenderness. No adnexal fullness. Anus and perineum within normal limits, no lesions.  No exam data present  Assessment/plan: Lisa Frank is a 22 y.o. female present for CPE with PAP.  Screening for iron deficiency anemia - CBC w/Diff BMI less than 19,adult Eats healthy Encounter for screening for HIV - HIV antibody (with reflex) Encounter for routine gynecological examination with Papanicolaou smear of cervix -  sexually active 22 year, started PAP screening today. She tolerated well.  - Cytology - PAP BCP (birth control pills) initiation - discussed different options with her today since she is sexually active. She is thinking about either the BCP or depo provera shot.  - called in BCP and explained how to start if she decides on this option. If she decides on depo provera she was counseled on potential irregular cycles heavy bleeding vs no bleeding etc. She will make an appt w/ provider to get preg test and then start injections if she decides on this option. - Condom use encouraged.  - norgestimate-ethinyl estradiol (ORTHO-CYCLEN,SPRINTEC,PREVIFEM) 0.25-35 MG-MCG tablet; Take 1 tablet by mouth daily.  Dispense: 1 Package; Refill: 11 Encounter for general adult medical examination with abnormal findings Patient was encouraged to exercise greater than 150 minutes a week. Patient was encouraged to choose a diet filled with fresh fruits and vegetables,  and lean meats. AVS provided to patient today for education/recommendation on gender specific health and safety maintenance. Colonoscopy: No Fhx  Mammogram: start screen at 35-40, FHX; SBE instructed today and encouraged the week after cycles.  Cervical cancer screening: Start screen at 56- completed today Immunizations: tdap UTD 2016, Influenza (encouraged yearly) Infectious disease screening: HIV completed 10/01/2016  Return in about 1 year (around 10/06/2018) for CPE.  Electronically signed by: Felix Pacini, DO [facility] [place]

## 2017-10-05 NOTE — Patient Instructions (Signed)
We will call you with your lab results once available. If your PAP is normal, you will need to repeat every 2-3 years. Follow yearly with physicals.   I have called in birth control pills for you. If you decide to start these, start on the first day of your cycle (instructions below) and take around the same time daily.   If you decide you would rather do the depo provera shot (every 3 months) be prepared for potential odd or more bleeding pattern as a potential for the first  Few months.    Oral Contraception Use Oral contraceptive pills (OCPs) are medicines taken to prevent pregnancy. OCPs work by preventing the ovaries from releasing eggs. The hormones in OCPs also cause the cervical mucus to thicken, preventing the sperm from entering the uterus. The hormones also cause the uterine lining to become thin, not allowing a fertilized egg to attach to the inside of the uterus. OCPs are highly effective when taken exactly as prescribed. However, OCPs do not prevent sexually transmitted diseases (STDs). Safe sex practices, such as using condoms along with an OCP, can help prevent STDs. Before taking OCPs, you may have a physical exam and Pap test. Your health care provider may also order blood tests if necessary. Your health care provider will make sure you are a good candidate for oral contraception. Discuss with your health care provider the possible side effects of the OCP you may be prescribed. When starting an OCP, it can take 2 to 3 months for the body to adjust to the changes in hormone levels in your body. How to take oral contraceptive pills Your health care provider may advise you on how to start taking the first cycle of OCPs. Otherwise, you can:  Start on day 1 of your menstrual period. You will not need any backup contraceptive protection with this start time.  Start on the first Sunday after your menstrual period or the day you get your prescription. In these cases, you will need to use  backup contraceptive protection for the first week.  Start the pill at any time of your cycle. If you take the pill within 5 days of the start of your period, you are protected against pregnancy right away. In this case, you will not need a backup form of birth control. If you start at any other time of your menstrual cycle, you will need to use another form of birth control for 7 days. If your OCP is the type called a minipill, it will protect you from pregnancy after taking it for 2 days (48 hours).  After you have started taking OCPs:  If you forget to take 1 pill, take it as soon as you remember. Take the next pill at the regular time.  If you miss 2 or more pills, call your health care provider because different pills have different instructions for missed doses. Use backup birth control until your next menstrual period starts.  If you use a 28-day pack that contains inactive pills and you miss 1 of the last 7 pills (pills with no hormones), it will not matter. Throw away the rest of the non-hormone pills and start a new pill pack.  No matter which day you start the OCP, you will always start a new pack on that same day of the week. Have an extra pack of OCPs and a backup contraceptive method available in case you miss some pills or lose your OCP pack. Follow these instructions at home:  Do not smoke.  Always use a condom to protect against STDs. OCPs do not protect against STDs.  Use a calendar to mark your menstrual period days.  Read the information and directions that came with your OCP. Talk to your health care provider if you have questions. Contact a health care provider if:  You develop nausea and vomiting.  You have abnormal vaginal discharge or bleeding.  You develop a rash.  You miss your menstrual period.  You are losing your hair.  You need treatment for mood swings or depression.  You get dizzy when taking the OCP.  You develop acne from taking the OCP.  You  become pregnant. Get help right away if:  You develop chest pain.  You develop shortness of breath.  You have an uncontrolled or severe headache.  You develop numbness or slurred speech.  You develop visual problems.  You develop pain, redness, and swelling in the legs. This information is not intended to replace advice given to you by your health care provider. Make sure you discuss any questions you have with your health care provider. Document Released: 02/11/2011 Document Revised: 07/31/2015 Document Reviewed: 08/13/2012 Elsevier Interactive Patient Education  2017 [place] Maintenance, Female Adopting a healthy lifestyle and getting preventive care can go a long way to promote health and wellness. Talk with your health care provider about what schedule of regular examinations is right for you. This is a good chance for you to check in with your provider about disease prevention and staying healthy. In between checkups, there are plenty of things you can do on your own. Experts have done a lot of research about which lifestyle changes and preventive measures are most likely to keep you healthy. Ask your health care provider for more information. Weight and diet Eat a healthy diet  Be sure to include plenty of vegetables, fruits, low-fat dairy products, and lean protein.  Do not eat a lot of foods high in solid fats, added sugars, or salt.  Get regular exercise. This is one of the most important things you can do for your health. ? Most adults should exercise for at least 150 minutes each week. The exercise should increase your heart rate and make you sweat (moderate-intensity exercise). ? Most adults should also do strengthening exercises at least twice a week. This is in addition to the moderate-intensity exercise.  Maintain a healthy weight  Body mass index (BMI) is a measurement that can be used to identify possible weight problems. It estimates body fat based  on height and weight. Your health care provider can help determine your BMI and help you achieve or maintain a healthy weight.  For females 59 years of age and older: ? A BMI below 18.5 is considered underweight. ? A BMI of 18.5 to 24.9 is normal. ? A BMI of 25 to 29.9 is considered overweight. ? A BMI of 30 and above is considered obese.  Watch levels of cholesterol and blood lipids  You should start having your blood tested for lipids and cholesterol at 22 years of age, then have this test every 5 years.  You may need to have your cholesterol levels checked more often if: ? Your lipid or cholesterol levels are high. ? You are older than 21 years of age. ? You are at high risk for heart disease.  Cancer screening Lung Cancer  Lung cancer screening is recommended for adults 64-89 years old who are at high risk for lung  cancer because of a history of smoking.  A yearly low-dose CT scan of the lungs is recommended for people who: ? Currently smoke. ? Have quit within the past 15 years. ? Have at least a 30-pack-year history of smoking. A pack year is smoking an average of one pack of cigarettes a day for 1 year.  Yearly screening should continue until it has been 15 years since you quit.  Yearly screening should stop if you develop a health problem that would prevent you from having lung cancer treatment.  Breast Cancer  Practice breast self-awareness. This means understanding how your breasts normally appear and feel.  It also means doing regular breast self-exams. Let your health care provider know about any changes, no matter how small.  If you are in your 20s or 30s, you should have a clinical breast exam (CBE) by a health care provider every 1-3 years as part of a regular health exam.  If you are 68 or older, have a CBE every year. Also consider having a breast X-ray (mammogram) every year.  If you have a family history of breast cancer, talk to your health care provider  about genetic screening.  If you are at high risk for breast cancer, talk to your health care provider about having an MRI and a mammogram every year.  Breast cancer gene (BRCA) assessment is recommended for women who have family members with BRCA-related cancers. BRCA-related cancers include: ? Breast. ? Ovarian. ? Tubal. ? Peritoneal cancers.  Results of the assessment will determine the need for genetic counseling and BRCA1 and BRCA2 testing.  Cervical Cancer Your health care provider may recommend that you be screened regularly for cancer of the pelvic organs (ovaries, uterus, and vagina). This screening involves a pelvic examination, including checking for microscopic changes to the surface of your cervix (Pap test). You may be encouraged to have this screening done every 3 years, beginning at age 53.  For women ages 50-65, health care providers may recommend pelvic exams and Pap testing every 3 years, or they may recommend the Pap and pelvic exam, combined with testing for human papilloma virus (HPV), every 5 years. Some types of HPV increase your risk of cervical cancer. Testing for HPV may also be done on women of any age with unclear Pap test results.  Other health care providers may not recommend any screening for nonpregnant women who are considered low risk for pelvic cancer and who do not have symptoms. Ask your health care provider if a screening pelvic exam is right for you.  If you have had past treatment for cervical cancer or a condition that could lead to cancer, you need Pap tests and screening for cancer for at least 20 years after your treatment. If Pap tests have been discontinued, your risk factors (such as having a new sexual partner) need to be reassessed to determine if screening should resume. Some women have medical problems that increase the chance of getting cervical cancer. In these cases, your health care provider may recommend more frequent screening and Pap  tests.  Colorectal Cancer  This type of cancer can be detected and often prevented.  Routine colorectal cancer screening usually begins at 21 years of age and continues through 21 years of age.  Your health care provider may recommend screening at an earlier age if you have risk factors for colon cancer.  Your health care provider may also recommend using home test kits to check for hidden blood in the  stool.  A small camera at the end of a tube can be used to examine your colon directly (sigmoidoscopy or colonoscopy). This is done to check for the earliest forms of colorectal cancer.  Routine screening usually begins at age 20.  Direct examination of the colon should be repeated every 5-10 years through 21 years of age. However, you may need to be screened more often if early forms of precancerous polyps or small growths are found.  Skin Cancer  Check your skin from head to toe regularly.  Tell your health care provider about any new moles or changes in moles, especially if there is a change in a mole's shape or color.  Also tell your health care provider if you have a mole that is larger than the size of a pencil eraser.  Always use sunscreen. Apply sunscreen liberally and repeatedly throughout the day.  Protect yourself by wearing long sleeves, pants, a wide-brimmed hat, and sunglasses whenever you are outside.  Heart disease, diabetes, and high blood pressure  High blood pressure causes heart disease and increases the risk of stroke. High blood pressure is more likely to develop in: ? People who have blood pressure in the high end of the normal range (130-139/85-89 mm Hg). ? People who are overweight or obese. ? People who are African American.  If you are 48-21 years of age, have your blood pressure checked every 3-5 years. If you are 26 years of age or older, have your blood pressure checked every year. You should have your blood pressure measured twice-once when you are at  a hospital or clinic, and once when you are not at a hospital or clinic. Record the average of the two measurements. To check your blood pressure when you are not at a hospital or clinic, you can use: ? An automated blood pressure machine at a pharmacy. ? A home blood pressure monitor.  If you are between 58 years and 1 years old, ask your health care provider if you should take aspirin to prevent strokes.  Have regular diabetes screenings. This involves taking a blood sample to check your fasting blood sugar level. ? If you are at a normal weight and have a low risk for diabetes, have this test once every three years after 21 years of age. ? If you are overweight and have a high risk for diabetes, consider being tested at a younger age or more often. Preventing infection Hepatitis B  If you have a higher risk for hepatitis B, you should be screened for this virus. You are considered at high risk for hepatitis B if: ? You were born in a country where hepatitis B is common. Ask your health care provider which countries are considered high risk. ? Your parents were born in a high-risk country, and you have not been immunized against hepatitis B (hepatitis B vaccine). ? You have HIV or AIDS. ? You use needles to inject street drugs. ? You live with someone who has hepatitis B. ? You have had sex with someone who has hepatitis B. ? You get hemodialysis treatment. ? You take certain medicines for conditions, including cancer, organ transplantation, and autoimmune conditions.  Hepatitis C  Blood testing is recommended for: ? Everyone born from [birth year] through 1964. ? Anyone with known risk factors for hepatitis C.  Sexually transmitted infections (STIs)  You should be screened for sexually transmitted infections (STIs) including gonorrhea and chlamydia if: ? You are sexually active and are younger than  21 years of age. ? You are older than 21 years of age and your health care provider tells  you that you are at risk for this type of infection. ? Your sexual activity has changed since you were last screened and you are at an increased risk for chlamydia or gonorrhea. Ask your health care provider if you are at risk.  If you do not have HIV, but are at risk, it may be recommended that you take a prescription medicine daily to prevent HIV infection. This is called pre-exposure prophylaxis (PrEP). You are considered at risk if: ? You are sexually active and do not regularly use condoms or know the HIV status of your partner(s). ? You take drugs by injection. ? You are sexually active with a partner who has HIV.  Talk with your health care provider about whether you are at high risk of being infected with HIV. If you choose to begin PrEP, you should first be tested for HIV. You should then be tested every 3 months for as long as you are taking PrEP. Pregnancy  If you are premenopausal and you may become pregnant, ask your health care provider about preconception counseling.  If you may become pregnant, take 400 to 800 micrograms (mcg) of folic acid every day.  If you want to prevent pregnancy, talk to your health care provider about birth control (contraception). Osteoporosis and menopause  Osteoporosis is a disease in which the bones lose minerals and strength with aging. This can result in serious bone fractures. Your risk for osteoporosis can be identified using a bone density scan.  If you are 60 years of age or older, or if you are at risk for osteoporosis and fractures, ask your health care provider if you should be screened.  Ask your health care provider whether you should take a calcium or vitamin D supplement to lower your risk for osteoporosis.  Menopause may have certain physical symptoms and risks.  Hormone replacement therapy may reduce some of these symptoms and risks. Talk to your health care provider about whether hormone replacement therapy is right for  you. Follow these instructions at home:  Schedule regular health, dental, and eye exams.  Stay current with your immunizations.  Do not use any tobacco products including cigarettes, chewing tobacco, or electronic cigarettes.  If you are pregnant, do not drink alcohol.  If you are breastfeeding, limit how much and how often you drink alcohol.  Limit alcohol intake to no more than 1 drink per day for nonpregnant women. One drink equals 12 ounces of beer, 5 ounces of wine, or 1 ounces of hard liquor.  Do not use street drugs.  Do not share needles.  Ask your health care provider for help if you need support or information about quitting drugs.  Tell your health care provider if you often feel depressed.  Tell your health care provider if you have ever been abused or do not feel safe at home. This information is not intended to replace advice given to you by your health care provider. Make sure you discuss any questions you have with your health care provider. Document Released: 09/07/2010 Document Revised: 07/31/2015 Document Reviewed: 11/26/2014 Elsevier Interactive Patient Education  2018 [place] Sex Practicing safe sex means taking steps before and during sex to reduce your risk of:  Getting an STD (sexually transmitted disease).  Giving your partner an STD.  Unwanted pregnancy.  How can I practice safe sex?  To practice  safe sex:  Limit your sexual partners to only one partner who is having sex with only you.  Avoid using alcohol and recreational drugs before having sex. These substances can affect your judgment.  Before having sex with a new partner: ? Talk to your partner about past partners, past STDs, and drug use. ? You and your partner should be screened for STDs and discuss the results with each other.  Check your body regularly for sores, blisters, rashes, or unusual discharge. If you notice any of these problems, visit your health care  provider.  If you have symptoms of an infection or you are being treated for an STD, avoid sexual contact.  While having sex, use a condom. Make sure to: ? Use a condom every time you have vaginal, oral, or anal sex. Both females and males should wear condoms during oral sex. ? Keep condoms in place from the beginning to the end of sexual activity. ? Use a latex condom, if possible. Latex condoms offer the best protection. ? Use only water-based lubricants or oils to lubricate a condom. Using petroleum-based lubricants or oils will weaken the condom and increase the chance that it will break.  See your health care provider for regular screenings, exams, and tests for STDs.  Talk with your health care provider about the form of birth control (contraception) that is best for you.  Get vaccinated against hepatitis B and human papillomavirus (HPV).  If you are at risk of being infected with HIV (human immunodeficiency virus), talk with your health care provider about taking a prescription medicine to prevent HIV infection. You are considered at risk for HIV if: ? You are a man who has sex with other men. ? You are a heterosexual man or woman who is sexually active with more than one partner. ? You take drugs by injection. ? You are sexually active with a partner who has HIV.  This information is not intended to replace advice given to you by your health care provider. Make sure you discuss any questions you have with your health care provider. Document Released: 04/01/2004 Document Revised: 07/09/2015 Document Reviewed: 01/12/2015 Elsevier Interactive Patient Education  [employer].

## 2017-10-06 LAB — HIV ANTIBODY (ROUTINE TESTING W REFLEX): HIV: NONREACTIVE

## 2017-10-11 ENCOUNTER — Telehealth: Payer: Self-pay | Admitting: Family Medicine

## 2017-10-11 DIAGNOSIS — R87610 Atypical squamous cells of undetermined significance on cytologic smear of cervix (ASC-US): Secondary | ICD-10-CM

## 2017-10-11 LAB — CYTOLOGY - PAP
CHLAMYDIA, DNA PROBE: NEGATIVE
DIAGNOSIS: UNDETERMINED — AB
HPV (WINDOPATH): NOT DETECTED
Neisseria Gonorrhea: NEGATIVE

## 2017-10-11 NOTE — Telephone Encounter (Signed)
Spoke with patient reviewed PAP results and instructions. Patient verbalized understanding. 

## 2017-10-11 NOTE — Telephone Encounter (Signed)
Please inform patient the following information: Her pap results have returned and show mild abnormal cells- of unknown significance, also call [facility]. These are NOT suspected to be cancer cells. This happens in young women frequently.  - Her HPV is negative, which is a test run with [facility] results. This is good. Since this is negative, we only need to repeat her PAP in 2-3 years.

## 2018-03-14 DIAGNOSIS — Z3202 Encounter for pregnancy test, result negative: Secondary | ICD-10-CM | POA: Diagnosis not present

## 2018-09-28 ENCOUNTER — Telehealth: Payer: Self-pay

## 2018-09-28 ENCOUNTER — Other Ambulatory Visit: Payer: Self-pay

## 2018-09-28 DIAGNOSIS — Z30011 Encounter for initial prescription of contraceptive pills: Secondary | ICD-10-CM

## 2018-09-28 MED ORDER — NORGESTIMATE-ETH ESTRADIOL 0.25-35 MG-MCG PO TABS
1.0000 | ORAL_TABLET | Freq: Every day | ORAL | 0 refills | Status: DC
Start: 2018-09-28 — End: 2018-10-20

## 2018-09-28 NOTE — Telephone Encounter (Signed)
Called patient about refill request faxed to [place] from [facility] [place] [facility]. Patient is overdue for a f/u. Advised patient to schedule her CPE before next refill is due. Patient said that she will call back later today and schedule.

## 2018-10-20 ENCOUNTER — Other Ambulatory Visit: Payer: Self-pay

## 2018-10-20 ENCOUNTER — Other Ambulatory Visit: Payer: Self-pay | Admitting: Family Medicine

## 2018-10-20 DIAGNOSIS — Z30011 Encounter for initial prescription of contraceptive pills: Secondary | ICD-10-CM

## 2018-10-20 MED ORDER — NORGESTIMATE-ETH ESTRADIOL 0.25-35 MG-MCG PO TABS: 1.0000 | ORAL_TABLET | Freq: Every day | ORAL | 0 refills | Status: DC

## 2018-10-20 NOTE — Telephone Encounter (Signed)
Filled patients BC for 30 days but insurance will only cover a 90 day supply.  NOV: 11/06/18  LOV: 10/05/17  Please advise, thank you

## 2018-11-06 ENCOUNTER — Ambulatory Visit (INDEPENDENT_AMBULATORY_CARE_PROVIDER_SITE_OTHER): Payer: 59 | Admitting: Family Medicine

## 2018-11-06 ENCOUNTER — Other Ambulatory Visit (HOSPITAL_COMMUNITY)
Admission: RE | Admit: 2018-11-06 | Discharge: 2018-11-06 | Disposition: A | Discharge status: Home / Self Care | Payer: 59 | Source: Ambulatory Visit | Attending: Family Medicine | Admitting: Family Medicine

## 2018-11-06 ENCOUNTER — Encounter: Payer: Self-pay | Admitting: Family Medicine

## 2018-11-06 ENCOUNTER — Other Ambulatory Visit: Payer: Self-pay

## 2018-11-06 VITALS — BP 94/63 | HR 85 | Temp 98.7°F | Resp 16 | Ht 63.0 in | Wt 92.4 lb

## 2018-11-06 DIAGNOSIS — Z30011 Encounter for initial prescription of contraceptive pills: Secondary | ICD-10-CM | POA: Diagnosis not present

## 2018-11-06 DIAGNOSIS — Z3041 Encounter for surveillance of contraceptive pills: Secondary | ICD-10-CM | POA: Insufficient documentation

## 2018-11-06 DIAGNOSIS — R634 Abnormal weight loss: Secondary | ICD-10-CM | POA: Diagnosis not present

## 2018-11-06 DIAGNOSIS — Z681 Body mass index (BMI) 19 or less, adult: Secondary | ICD-10-CM

## 2018-11-06 DIAGNOSIS — Z0000 Encounter for general adult medical examination without abnormal findings: Secondary | ICD-10-CM

## 2018-11-06 DIAGNOSIS — Z118 Encounter for screening for other infectious and parasitic diseases: Secondary | ICD-10-CM

## 2018-11-06 MED ORDER — NORGESTIMATE-ETH ESTRADIOL 0.25-35 MG-MCG PO TABS: 1.0000 | ORAL_TABLET | Freq: Every day | ORAL | 4 refills | Status: AC

## 2018-11-06 NOTE — Patient Instructions (Signed)
Health Maintenance, Female Adopting a healthy lifestyle and getting preventive care are important in promoting health and wellness. Ask your health care provider about:  The right schedule for you to have regular tests and exams.  Things you can do on your own to prevent diseases and keep yourself healthy. What should I know about diet, weight, and exercise? Eat a healthy diet   Eat a diet that includes plenty of vegetables, fruits, low-fat dairy products, and lean protein.  Do not eat a lot of foods that are high in solid fats, added sugars, or sodium. Maintain a healthy weight Body mass index (BMI) is used to identify weight problems. It estimates body fat based on height and weight. Your health care provider can help determine your BMI and help you achieve or maintain a healthy weight. Get regular exercise Get regular exercise. This is one of the most important things you can do for your health. Most adults should:  Exercise for at least 150 minutes each week. The exercise should increase your heart rate and make you sweat (moderate-intensity exercise).  Do strengthening exercises at least twice a week. This is in addition to the moderate-intensity exercise.  Spend less time sitting. Even light physical activity can be beneficial. Watch cholesterol and blood lipids Have your blood tested for lipids and cholesterol at 22 years of age, then have this test every 5 years. Have your cholesterol levels checked more often if:  Your lipid or cholesterol levels are high.  You are older than 22 years of age.  You are at high risk for heart disease. What should I know about cancer screening? Depending on your health history and family history, you may need to have cancer screening at various ages. This may include screening for:  Breast cancer.  Cervical cancer.  Colorectal cancer.  Skin cancer.  Lung cancer. What should I know about heart disease, diabetes, and high blood  pressure? Blood pressure and heart disease  High blood pressure causes heart disease and increases the risk of stroke. This is more likely to develop in people who have high blood pressure readings, are of African descent, or are overweight.  Have your blood pressure checked: ? Every 3-5 years if you are 18-39 years of age. ? Every year if you are 40 years old or older. Diabetes Have regular diabetes screenings. This checks your fasting blood sugar level. Have the screening done:  Once every three years after age 40 if you are at a normal weight and have a low risk for diabetes.  More often and at a younger age if you are overweight or have a high risk for diabetes. What should I know about preventing infection? Hepatitis B If you have a higher risk for hepatitis B, you should be screened for this virus. Talk with your health care provider to find out if you are at risk for hepatitis B infection. Hepatitis C Testing is recommended for:  Everyone born from 1945 through 1965.  Anyone with known risk factors for hepatitis C. Sexually transmitted infections (STIs)  Get screened for STIs, including gonorrhea and chlamydia, if: ? You are sexually active and are younger than 22 years of age. ? You are older than 22 years of age and your health care provider tells you that you are at risk for this type of infection. ? Your sexual activity has changed since you were last screened, and you are at increased risk for chlamydia or gonorrhea. Ask your health care provider if   you are at risk.  Ask your health care provider about whether you are at high risk for HIV. Your health care provider may recommend a prescription medicine to help prevent HIV infection. If you choose to take medicine to prevent HIV, you should first get tested for HIV. You should then be tested every 3 months for as long as you are taking the medicine. Pregnancy  If you are about to stop having your period (premenopausal) and  you may become pregnant, seek counseling before you get pregnant.  Take 400 to 800 micrograms (mcg) of folic acid every day if you become pregnant.  Ask for birth control (contraception) if you want to prevent pregnancy. Osteoporosis and menopause Osteoporosis is a disease in which the bones lose minerals and strength with aging. This can result in bone fractures. If you are 65 years old or older, or if you are at risk for osteoporosis and fractures, ask your health care provider if you should:  Be screened for bone loss.  Take a calcium or vitamin D supplement to lower your risk of fractures.  Be given hormone replacement therapy (HRT) to treat symptoms of menopause. Follow these instructions at home: Lifestyle  Do not use any products that contain nicotine or tobacco, such as cigarettes, e-cigarettes, and chewing tobacco. If you need help quitting, ask your health care provider.  Do not use street drugs.  Do not share needles.  Ask your health care provider for help if you need support or information about quitting drugs. Alcohol use  Do not drink alcohol if: ? Your health care provider tells you not to drink. ? You are pregnant, may be pregnant, or are planning to become pregnant.  If you drink alcohol: ? Limit how much you use to 0-1 drink a day. ? Limit intake if you are breastfeeding.  Be aware of how much alcohol is in your drink. In the [place] one drink equals one 12 oz bottle of beer (355 mL), one 5 oz glass of wine (148 mL), or one 1 oz glass of hard liquor (44 mL). General instructions  Schedule regular health, dental, and eye exams.  Stay current with your vaccines.  Tell your health care provider if: ? You often feel depressed. ? You have ever been abused or do not feel safe at home. Summary  Adopting a healthy lifestyle and getting preventive care are important in promoting health and wellness.  Follow your health care provider's instructions about healthy  diet, exercising, and getting tested or screened for diseases.  Follow your health care provider's instructions on monitoring your cholesterol and blood pressure. This information is not intended to replace advice given to you by your health care provider. Make sure you discuss any questions you have with your health care provider. Document Released: 09/07/2010 Document Revised: 02/15/2018 Document Reviewed: 02/15/2018 Elsevier Patient Education  2020 Elsevier Inc.  

## 2018-11-06 NOTE — Progress Notes (Signed)
Patient ID: Lisa Frank, female  DOB: 07-Dec-1995, 23 y.o.   MRN: [id number] Patient Care Team    Relationship Specialty Notifications Start End  Ma Hillock, DO PCP - General Family Medicine  10/01/16     Chief Complaint  Patient presents with  . Annual Exam    fasting. doesn't want a flu shot today. pap 09/2017    Subjective:  Lisa Frank is a 23 y.o.  Female  present for CPE. All past medical history, surgical history, allergies, family history, immunizations, medications and social history were updated in the electronic medical record today. All recent labs, ED visits and hospitalizations within the last year were reviewed.  Well woman exam: Patient is a 23 year old African-American female present for well woman exam today. She reports cycles are normal on BCP pills.  She has been sexually active, but not at this time. Patient's last menstrual period was Patient's last menstrual period was 10/17/2018 (exact date). She is finishing her last semester virtually at [employer].  Health maintenance:updated today Colonoscopy:No Fhx Mammogram:start screen at 35-40, FHX; SBE instructed today and encouraged the week after cycles.  Cervical cancer screening:09/2017 ASCUS w/neg HPV- rpt 2 Immunizations: tdapUTD 2016, Influenza declined today (encouraged yearly) Infectious disease screening: HIVcompleted 10/01/2016 DEXA:N/A Assistive device: none Oxygen GEX:BMWU Patient has a Dental home. Hospitalizations/ED visits: reviewed   Depression screen [facility] 2/9 11/06/2018 10/05/2017 10/01/2016  Decreased Interest 0 0 0  Down, Depressed, Hopeless 0 0 0  PHQ - 2 Score 0 0 0   No flowsheet data found.   Immunization History  Administered Date(s) Administered  . Tdap 04/08/2014   Past Medical History:  Diagnosis Date  . Menorrhagia   . Wears glasses    No Known Allergies Past Surgical History:  Procedure Laterality Date  . NO PAST SURGERIES     Family History   Problem Relation Age of Onset  . Breast cancer Maternal Aunt 40  . HIV/AIDS Paternal Aunt   . Alcohol abuse Paternal Uncle   . Breast cancer Maternal Grandmother 48   Social History   Social History Narrative   Single. Electronics engineer.   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. she lives off campus with her parents.   Drinks caffeine   wears her seatbelt, smoke detector in the home   Feels safe in her relationships.    Allergies as of 11/06/2018   No Known Allergies     Medication List       Accurate as of November 06, 2018  3:06 PM. If you have any questions, ask your nurse or doctor.        Sprintec 28 0.25-35 MG-MCG tablet Generic drug: norgestimate-ethinyl estradiol TAKE 1 TABLET BY MOUTH EVERY DAY       All past medical history, surgical history, allergies, family history, immunizations andmedications were updated in the EMR today and reviewed under the history and medication portions of their EMR.     No results found for this or any previous visit (from the past 2160 hour(s)).  No results found.   ROS: 14 pt review of systems performed and negative (unless mentioned in an HPI)  Objective: BP 94/63 (BP Location: Left Arm, Patient Position: Sitting, Cuff Size: Normal)   Pulse 85   Temp 98.7 F (37.1 C) (Temporal)   Resp 16   Ht 5\' 3"  (1.6 m)   Wt 92 lb 6 oz (41.9 kg)   LMP 10/17/2018 (Exact Date)   SpO2 97%  BMI 16.36 kg/m  Gen: Afebrile. No acute distress. Nontoxic in appearance, well-developed, well-nourished,  Pleasant AAF HENT: AT. [place]. Bilateral TM visualized and normal in appearance, normal external auditory canal. MMM, no oral lesions, adequate dentition. Bilateral nares within normal limits. Throat without erythema, ulcerations or exudates. no Cough on exam, no hoarseness on exam. Eyes:Pupils Equal Round Reactive to light, Extraocular movements intact,  Conjunctiva without redness, discharge or icterus. Neck/lymp/endocrine: Supple,no lymphadenopathy, no  thyromegaly CV: RRR no murmur, no edema, +2/4 P posterior tibialis pulses.Marland Kitchen. No JVD. Chest: CTAB, no wheeze, rhonchi or crackles. normal Respiratory effort. good Air movement. Abd: Soft. flat. NTND. BS present. no Masses palpated. No hepatosplenomegaly. No rebound tenderness or guarding. Skin: no rashes, purpura or petechiae. Warm and well-perfused. Skin intact. Neuro/Msk:  Normal gait. PERLA. EOMi. Alert. Oriented x3.  Cranial nerves II through XII intact. Muscle strength 5/5 upper/lower extremity. DTRs equal bilaterally. Psych: Normal affect, dress and demeanor. Normal speech. Normal thought content and judgment.  No exam data present  Assessment/plan: Lisa Frank is a 23 y.o. female present for CPE Encounter for preventive health examination Patient was encouraged to exercise greater than 150 minutes a week. Patient was encouraged to choose a diet filled with fresh fruits and vegetables, and lean meats. AVS provided to patient today for education/recommendation on gender specific health and safety maintenance. Colonoscopy:No Fhx Mammogram:start screen at 35-40, FHX; SBE instructed today and encouraged the week after cycles.  Cervical cancer screening:09/2017 ASCUS w/neg HPV- rpt 2021 Immunizations: tdapUTD 2016, Influenza declined today (encouraged yearly) Infectious disease screening: HIVcompleted 10/01/2016 DEXA:N/A Encounter for screening examination for chlamydial infection - Urine cytology ancillary only([place]) Encounter for birth control pills maintenance - f/u yearly. PAP next year,  - norgestimate-ethinyl estradiol (SPRINTEC 28) 0.25-35 MG-MCG tablet; Take 1 tablet by mouth daily.  Dispense: 3 Package; Refill: 4 Unintentional weight loss/BMI less than 19,adult TSH collected today.  - she has changed her diet to attempt to gain weight and continues to lose weight.  - discussed nutrition referral If thyroid is normal. She is agreeable.    Return in about 1 year  (around 11/06/2019) for CPE (30 min).  Electronically signed by: Felix Pacinienee Marayah Higdon, DO [facility]

## 2018-11-07 LAB — TSH: TSH: 1.54 u[IU]/mL (ref 0.35–4.50)

## 2018-11-08 ENCOUNTER — Telehealth: Payer: Self-pay | Admitting: Family Medicine

## 2018-11-08 DIAGNOSIS — R634 Abnormal weight loss: Secondary | ICD-10-CM

## 2018-11-08 DIAGNOSIS — Z681 Body mass index (BMI) 19 or less, adult: Secondary | ICD-10-CM

## 2018-11-08 LAB — URINE CYTOLOGY ANCILLARY ONLY
Chlamydia: NEGATIVE
Neisseria Gonorrhea: NEGATIVE

## 2018-11-08 NOTE — Telephone Encounter (Signed)
Please inform patient the following information: Her thyroid is normal and her urine/chlamydia screen is normal. I have placed the referral to a nutritionist to discuss her weight and diet. BMI 16.

## 2018-11-08 NOTE — Telephone Encounter (Signed)
Pt was called and given lab results, she verbalized understanding.  

## 2018-11-20 ENCOUNTER — Encounter: Payer: Self-pay | Admitting: Family Medicine

## 2018-11-29 ENCOUNTER — Encounter: Payer: Self-pay | Admitting: Registered"

## 2018-11-29 ENCOUNTER — Other Ambulatory Visit: Payer: Self-pay

## 2018-11-29 ENCOUNTER — Encounter: Payer: 59 | Attending: Family Medicine | Admitting: Registered"

## 2018-11-29 DIAGNOSIS — R634 Abnormal weight loss: Secondary | ICD-10-CM | POA: Diagnosis not present

## 2018-11-29 NOTE — Patient Instructions (Signed)
Instructions/Goals:  Eating Schedule:  Breakfast: within 1 hour waking  Snack 1: 2 hours after breakfast  Lunch: 2 hours after #1 snack  Snack 2: 2 hours after lunch  Dinner: 2 hours after #2 snack  Snack 3 (optional): 2 hours after dinner  Add high calorie ingredients to food to boost calories/bite:   Add oils, dressings, dips, cheeses, creams, etc to foods at meals  Add nut butters, dips, or cheese to snacks   See handout  Recommend 1 Boost Plus daily along with snack or breakfast. If unable to include 3 meals, drink an additional Boost Plus at that mealtime.

## 2018-11-29 NOTE — Progress Notes (Signed)
Medical Nutrition Therapy:  Appt start time: 1409 end time:  1509.  Assessment:  Primary concerns today: Pt referred due to weight loss. Pt present for appointment alone. Pt reports she wants to gain weight but also wants to improve appetite. Pt reports she will feel hungry but will not feel need to eat. May eat 1-2 times per day (lunch and/or dinner). Reports she used to take protein drinks (Boost and Serious Mass High Protein Weight Gainer) and felt she lost during that time rather than gained. Pt reports she feels poor appetite has been going on long-term. Reports normal wt for her is between 100-103 lb and she is anxious about the further weight loss. Pt reports weight today of 92 lb is lowest she has been recently. Pt denies GI issues. Reports feeling nauseous if she eats past the feeling of fullness. Pt reports she wants to gain 30 lb because she feels this would be a healthy weight for her.   Pt reports she only eats when hungry which will often not be until mid-afternoon. Pt wakes up around 930 AM and goes to sleep around 02-1229. 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   Food Allergies/Intolerances: None reported.   GI Concerns: None reported.   Pertinent Lab Values: N/A  Weight Hx: Reports normal weight is around 100-103 lb and today pt weighed 92 lb in office.   Preferred Learning Style:   No preference indicated   Learning Readiness:   Ready  MEDICATIONS: Reviewed. See list.    DIETARY INTAKE:  Usual eating pattern includes 1-2 meals (lunch and dinner) and sometimes snacks.  Common foods: bread; chicken; rice.  Avoided foods: brussels sprouts, sweet potatoes; yams. Pt drinks milk with tea or cereal primarily, yogurt is ok, likes cheese.   Typical Snacks: chips.      Typical Beverages: 2 Smart Water bottles (at least 64 oz total); juice.  Location of Meals: kitchen at the counter.   Electronics Present at [employer]: Yes: phone, TV  24-hr recall: Reports this day was  typical.  B ( AM): None reported.  Snk ( AM): None reported.  L (1 PM): King sized Reeces, 1 bottle water; [place] sandwich on oatmeal bread, cheese, mayo, mustard, tomato, lettuce, Cheetos 1 oz bag Snk (2-3 PM): Cheetos 1 oz bag   D (845 PM): rice and chicken, water  Snk ( PM): None reported.  Beverages: water  Usual physical activity: None currently.  Minutes/Week: N/A  Estimated energy needs: 1755 calories (+500 calories to promote weight gain) 197-285 g carbohydrates 33 g protein 39-68 g fat  Progress Towards Goal(s):  In progress.   Nutritional Diagnosis:  NI-5.11.1 Predicted suboptimal nutrient intake As related to skipping meals.  As evidenced by pt reports including only 1-2 meals per day.    Intervention:  Nutrition counseling provided. Dietitian provided education regarding high calorie nutrition therapy. Discussed how the body can become used to eating smaller portions when appetite is low and that gradually eating more often and gradually increasing portions can help regain appetite and increase weight. Discussed adding in a supplemental drink such as Boost or Ensure Plus which will provide more calories per oz than regular meal replacements drinks as drinking calories can be easier than including through solid foods when appetite is low. Also discussed making the most of every bite. Pt appeared agreeable to information/goals discussed.   Instructions/Goals:  Eating Schedule:  Breakfast: within 1 hour waking  Snack 1: 2 hours after breakfast  Lunch: 2 hours  after #1 snack  Snack 2: 2 hours after lunch  Dinner: 2 hours after #2 snack  Snack 3 (optional): 2 hours after dinner  Add high calorie ingredients to food to boost calories/bite:   Add oils, dressings, dips, cheeses, creams, etc to foods at meals  Add nut butters, dips, or cheese to snacks   See handout  Recommend 1 Boost Plus daily along with snack or breakfast. If unable to include 3 meals, drink an  additional Boost Plus at that mealtime.   Teaching Method Utilized: Visual Auditory  Handouts given during visit include:  High Calorie Nutrition Therapy   Barriers to learning/adherence to lifestyle change: None reported.   Demonstrated degree of understanding via:  Teach Back   Monitoring/Evaluation:  Dietary intake, exercise, and body weight in 1 month(s).

## 2019-02-15 ENCOUNTER — Ambulatory Visit (HOSPITAL_COMMUNITY)
Admission: EM | Admit: 2019-02-15 | Discharge: 2019-02-15 | Disposition: A | Discharge status: Home / Self Care | Payer: No Typology Code available for payment source | Attending: Emergency Medicine | Admitting: Emergency Medicine

## 2019-02-15 ENCOUNTER — Encounter (HOSPITAL_COMMUNITY): Payer: Self-pay

## 2019-02-15 ENCOUNTER — Emergency Department (HOSPITAL_COMMUNITY)
Admission: EM | Admit: 2019-02-15 | Discharge: 2019-02-16 | Disposition: A | Discharge status: Home / Self Care | Payer: 59 | Attending: Emergency Medicine | Admitting: Emergency Medicine

## 2019-02-15 DIAGNOSIS — R1033 Periumbilical pain: Secondary | ICD-10-CM | POA: Diagnosis not present

## 2019-02-15 DIAGNOSIS — Z793 Long term (current) use of hormonal contraceptives: Secondary | ICD-10-CM | POA: Diagnosis not present

## 2019-02-15 DIAGNOSIS — Z0441 Encounter for examination and observation following alleged adult rape: Secondary | ICD-10-CM | POA: Diagnosis present

## 2019-02-15 LAB — URINALYSIS, ROUTINE W REFLEX MICROSCOPIC
Bilirubin Urine: NEGATIVE
Glucose, UA: NEGATIVE mg/dL
Ketones, ur: 5 mg/dL — AB
Leukocytes,Ua: NEGATIVE
Nitrite: NEGATIVE
Protein, ur: 100 mg/dL — AB
Specific Gravity, Urine: 1.032 — ABNORMAL HIGH (ref 1.005–1.030)
pH: 5 (ref 5.0–8.0)

## 2019-02-15 MED ORDER — METRONIDAZOLE 500 MG PO TABS
2000.0000 mg | ORAL_TABLET | Freq: Once | ORAL | Status: AC
  Administered 2019-02-16: 2000 mg via ORAL

## 2019-02-15 MED ORDER — AZITHROMYCIN 250 MG PO TABS
1000.0000 mg | ORAL_TABLET | Freq: Once | ORAL | Status: AC
  Administered 2019-02-16: 1000 mg via ORAL

## 2019-02-15 MED ORDER — LIDOCAINE HCL (PF) 1 % IJ SOLN
0.9000 mL | Freq: Once | INTRAMUSCULAR | Status: AC
  Administered 2019-02-16: 0.9 mL

## 2019-02-15 MED ORDER — CEFTRIAXONE SODIUM 250 MG IJ SOLR
250.0000 mg | Freq: Once | INTRAMUSCULAR | Status: AC
  Administered 2019-02-16: 250 mg via INTRAMUSCULAR

## 2019-02-15 MED ORDER — ULIPRISTAL ACETATE 30 MG PO TABS
30.0000 mg | ORAL_TABLET | Freq: Once | ORAL | Status: AC
Start: 2019-02-15 — End: 2019-02-16
  Administered 2019-02-16: 30 mg via ORAL

## 2019-02-15 NOTE — ED Notes (Signed)
Sane nurse notified and will be here in appx 15 minutes.
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
## 2019-02-16 ENCOUNTER — Other Ambulatory Visit: Payer: Self-pay

## 2019-02-16 ENCOUNTER — Encounter (HOSPITAL_COMMUNITY): Payer: Self-pay | Admitting: Emergency Medicine

## 2019-02-16 ENCOUNTER — Emergency Department (HOSPITAL_COMMUNITY)
Admission: EM | Admit: 2019-02-16 | Discharge: 2019-02-16 | Disposition: A | Discharge status: Home / Self Care | Payer: 59 | Source: Home / Self Care | Attending: Emergency Medicine | Admitting: Emergency Medicine

## 2019-02-16 DIAGNOSIS — R7989 Other specified abnormal findings of blood chemistry: Secondary | ICD-10-CM

## 2019-02-16 LAB — PREGNANCY, URINE: Preg Test, Ur: NEGATIVE

## 2019-02-16 LAB — HEPATITIS C ANTIBODY: HCV Ab: NONREACTIVE

## 2019-02-16 LAB — COMPREHENSIVE METABOLIC PANEL
ALT: 117 U/L — ABNORMAL HIGH (ref 0–44)
AST: 60 U/L — ABNORMAL HIGH (ref 15–41)
Albumin: 3.6 g/dL (ref 3.5–5.0)
Alkaline Phosphatase: 35 U/L — ABNORMAL LOW (ref 38–126)
Anion gap: 9 (ref 5–15)
BUN: 12 mg/dL (ref 6–20)
CO2: 21 mmol/L — ABNORMAL LOW (ref 22–32)
Calcium: 8.9 mg/dL (ref 8.9–10.3)
Chloride: 107 mmol/L (ref 98–111)
Creatinine, Ser: 0.81 mg/dL (ref 0.44–1.00)
GFR calc Af Amer: >60 mL/min (ref >60)
GFR calc non Af Amer: >60 mL/min (ref >60)
Glucose, Bld: 102 mg/dL — ABNORMAL HIGH (ref 70–99)
Potassium: 3.3 mmol/L — ABNORMAL LOW (ref 3.5–5.1)
Sodium: 137 mmol/L (ref 135–145)
Total Bilirubin: 0.6 mg/dL (ref 0.3–1.2)
Total Protein: 7 g/dL (ref 6.5–8.1)

## 2019-02-16 LAB — HEPATITIS B SURFACE ANTIGEN: Hepatitis B Surface Ag: NONREACTIVE

## 2019-02-16 LAB — RAPID HIV SCREEN (HIV 1/2 AB+AG)
HIV 1/2 Antibodies: NONREACTIVE
HIV-1 P24 Antigen - HIV24: NONREACTIVE

## 2019-02-16 MED ORDER — ELVITEG-COBIC-EMTRICIT-TENOFAF 150-150-200-10 MG PO TABS: 1.0000 | ORAL_TABLET | Freq: Every day | ORAL | 0 refills | Status: AC

## 2019-02-16 MED ORDER — ELVITEG-COBIC-EMTRICIT-TENOFAF 150-150-200-10 MG PO TABS: 1.0000 | ORAL_TABLET | Freq: Every day | ORAL | 0 refills | Status: DC

## 2019-02-16 MED ORDER — ELVITEG-COBIC-EMTRICIT-TENOFAF 150-150-200-10 MG PREPACK
5.0000 | ORAL_TABLET | Freq: Once | ORAL | Status: AC
  Administered 2019-02-16: 5 via ORAL
  Filled 2019-02-16: qty 1

## 2019-02-16 MED FILL — GENVOYA TABLET: 150-150-200 | 30 days supply | Qty: 30 | Fill #0
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
## 2019-02-16 NOTE — SANE Note (Signed)
On 02/16/2019, at approximately 1645 hours, I spoke with the pt to obtain her insurance information for co-pay assistance from [employer] ([employer]).  The pt advised that she has the following insurance:  -Theme park manager (under her Mother's Name:  Kennon Rounds of [address], [place], [place] [address]; Cell:  [phone])  -HEALTH PLAN #:  [id number]  -MEMBER ID:  [id number]  -GROUP #:  [id number]  -PAY ID:  [id number]  [id number]   The following Co-Pay Voucher #'s were then obtained from [place] ([employer]):  BIN #:  [id number]  PCN #:  ACCESS  GROUP #:  [id number]  ID #:  [id number]   The ED Provider e-scribed the prescription to the [facility].  The pt's mailing address is:  [address] [place], [place] [address]  The pt's cell is:  [phone]  The pt's email address is:  [email or web address]

## 2019-02-16 NOTE — SANE Note (Signed)
On 02/16/2019, at approximately 1617 hours, I spoke with Britni, the ED Provider, and entered the orders for HIV nPEP Genvoya.    Gerald Stabs, ED RN, was advised that the 5-day pre-pack can be retrieved from the ED Pyxis, under SANE # 3.   After the results of the Rapid HIV test are received, and they are negative, then the pre-pack can be given to the pt.  Pt advised to take 1 tab, daily, and to take with food.
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
## 2019-02-17 LAB — RPR: RPR Ser Ql: NONREACTIVE

## 2019-06-28 ENCOUNTER — Ambulatory Visit: Payer: 59

## 2019-08-27 ENCOUNTER — Ambulatory Visit (INDEPENDENT_AMBULATORY_CARE_PROVIDER_SITE_OTHER): Payer: 59 | Admitting: Podiatry

## 2019-08-27 ENCOUNTER — Other Ambulatory Visit: Payer: Self-pay

## 2019-08-27 ENCOUNTER — Ambulatory Visit (INDEPENDENT_AMBULATORY_CARE_PROVIDER_SITE_OTHER): Payer: 59

## 2019-08-27 DIAGNOSIS — M2042 Other hammer toe(s) (acquired), left foot: Secondary | ICD-10-CM | POA: Diagnosis not present

## 2019-08-27 DIAGNOSIS — M2041 Other hammer toe(s) (acquired), right foot: Secondary | ICD-10-CM

## 2019-08-27 NOTE — Patient Instructions (Signed)
Pre-Operative Instructions  Congratulations, you have decided to take an important step towards improving your quality of life.  You can be assured that the doctors and staff at [facility] will be with you every step of the way.  Here are some important things you should know:  1. Plan to be at the surgery center/hospital at least 1 (one) hour prior to your scheduled time, unless otherwise directed by the surgical center/hospital staff.  You must have a responsible adult accompany you, remain during the surgery and drive you home.  Make sure you have directions to the surgical center/hospital to ensure you arrive on time. 2. If you are having surgery at [facility] or [facility] hospitals, you will need a copy of your medical history and physical form from your family physician within one month prior to the date of surgery. We will give you a form for your primary physician to complete.  3. We make every effort to accommodate the date you request for surgery.  However, there are times where surgery dates or times have to be moved.  We will contact you as soon as possible if a change in schedule is required.   4. No aspirin/ibuprofen for one week before surgery.  If you are on aspirin, any non-steroidal anti-inflammatory medications (Mobic, Aleve, Ibuprofen) should not be taken seven (7) days prior to your surgery.  You make take Tylenol for pain prior to surgery.  5. Medications - If you are taking daily heart and blood pressure medications, seizure, reflux, allergy, asthma, anxiety, pain or diabetes medications, make sure you notify the surgery center/hospital before the day of surgery so they can tell you which medications you should take or avoid the day of surgery. 6. No food or drink after midnight the night before surgery unless directed otherwise by surgical center/hospital staff. 7. No alcoholic beverages 24-hours prior to surgery.  No smoking 24-hours prior or 24-hours after  surgery. 8. Wear loose pants or shorts. They should be loose enough to fit over bandages, boots, and casts. 9. Don't wear slip-on shoes. Sneakers are preferred. 10. Bring your boot with you to the surgery center/hospital.  Also bring crutches or a walker if your physician has prescribed it for you.  If you do not have this equipment, it will be provided for you after surgery. 11. If you have not been contacted by the surgery center/hospital by the day before your surgery, call to confirm the date and time of your surgery. 12. Leave-time from work may vary depending on the type of surgery you have.  Appropriate arrangements should be made prior to surgery with your employer. 13. Prescriptions will be provided immediately following surgery by your doctor.  Fill these as soon as possible after surgery and take the medication as directed. Pain medications will not be refilled on weekends and must be approved by the doctor. 14. Remove nail polish on the operative foot and avoid getting pedicures prior to surgery. 15. Wash the night before surgery.  The night before surgery wash the foot and leg well with water and the antibacterial soap provided. Be sure to pay special attention to beneath the toenails and in between the toes.  Wash for at least three (3) minutes. Rinse thoroughly with water and dry well with a towel.  Perform this wash unless told not to do so by your physician.  Enclosed: 1 Ice pack (please put in freezer the night before surgery)   1 Hibiclens skin cleaner     Pre-op instructions  If you have any questions regarding the instructions, please do not hesitate to call our office.  [place]: [address], [place], [place] [address] -- [phone]  [place]: [address] [place], [place] [address] -- [phone]  [place]: [address], [place], [place] [address] -- [phone]   Website: [email or web address] 

## 2019-09-02 NOTE — Progress Notes (Signed)
Subjective:   Patient ID: Lisa Frank, female   DOB: 24 y.o.   MRN: [id number]   HPI 24 year old female presents the office today for concerns of both of her second toes rubbing inside shoes and she feels a "pinching" sensation to her toes when she wear shoes.  She has tried over-the-counter pads as well as changing shoes and inserts which helps some she states the toes continue to rub.  She wants to consider surgical intervention to help straighten her second toes to avoid the pressure.  She points on the PIPJ where she gets the majority discomfort.  She states the third toes are starting but not causing significant discomfort.  Review of Systems  All other systems reviewed and are negative.  Past Medical History:  Diagnosis Date   Menorrhagia    Wears glasses     Past Surgical History:  Procedure Laterality Date   NO PAST SURGERIES       Current Outpatient Medications:    elvitegravir-cobicistat-emtricitabine-tenofovir (GENVOYA) 150-150-200-10 MG TABS tablet, Take 1 tablet by mouth daily with breakfast., Disp: 30 tablet, Rfl: 0   norgestimate-ethinyl estradiol (SPRINTEC 28) 0.25-35 MG-MCG tablet, Take 1 tablet by mouth daily., Disp: 3 Package, Rfl: 4  No Known Allergies       Objective:  Physical Exam  General: AAO x3, NAD  Dermatological: Mild hyperkeratotic tissue dorsal PIPJ to the second toes bilaterally.  No open lesions identified.  Vascular: Dorsalis Pedis artery and Posterior Tibial artery pedal pulses are 2/4 bilateral with immedate capillary fill time. There is no pain with calf compression, swelling, warmth, erythema.   Neruologic: Grossly intact via light touch bilateral.   Musculoskeletal: Flexible mild hammertoe contractures are present bilaterally with a resulting discomfort of the dorsal PIPJ.  Second toes are elongated.  Mild hammertoe contractures present to the digits as well as adductovarus of the fifth digits.  Muscular strength 5/5 in all  groups tested bilateral.  Gait: Unassisted, Nonantalgic.       Assessment:   Symptomatic hammertoe deformities bilateral second toes     Plan:  -Treatment options discussed including all alternatives, risks, and complications -Etiology of symptoms were discussed -X-rays were obtained and reviewed with the patient.  No significant evidence of acute fracture.  Minimal hammertoe contracture present. -We long discussion today in regards to both conservative as well as surgical treatment options.  She is tried offloading pads, shoe modifications and she continues to get discomfort.  At this time we discussed PIPJ arthroplasty bilateral second toes.  Discussed this is not a guarantee there is always a chance that she will need to have surgery in the future or recurrence of the deformity.  She understands this and given the discomfort she was to proceed with surgical intervention. -We will plan for bilateral PIPJ arthroplasties -The incision placement as well as the postoperative course was discussed with the patient. I discussed risks of the surgery which include, but not limited to, infection, bleeding, pain, swelling, need for further surgery, delayed or nonhealing, painful or ugly scar, numbness or sensation changes, over/under correction, recurrence, transfer lesions, further deformity, DVT/PE, loss of toe/foot. Patient understands these risks and wishes to proceed with surgery. The surgical consent was reviewed with the patient all 3 pages were signed. No promises or guarantees were given to the outcome of the procedure. All questions were answered to the best of my ability. Before the surgery the patient was encouraged to call the office if there is any further questions. The  surgery will be performed at the [facility] on an outpatient basis.  No follow-ups on file.  Vivi Barrack DPM

## 2019-10-09 ENCOUNTER — Telehealth: Payer: Self-pay

## 2019-10-09 NOTE — Telephone Encounter (Signed)
DOS 10/17/2019  HAMMERTOE REPAIR 2ND B/L  [employer] EFFECTIVE DATE - 03/09/2019  PLAN DEDUCTIBLE - Member's plan does not have an In-Network individual plan deductible OUT OF POCKET - $6600.00 W/ $5889.04 REMAINING COPAY $0.00 COINSURANCE - 10%   NOTIFICATION/PRIOR AUTHORIZATION NUMBER CASE STATUS CASE STATUS REASON PRIMARY CARE PHYSICIAN [id number] Closed Case Was Managed And Is Now Complete - ADVANCE NOTIFY DATE/TIME ADMISSION NOTIFY DATE/TIME 10/05/2019 09:00 AM CDT - COVERAGE STATUS OVERALL COVERAGE STATUS Covered/Approved 1-2 CODE DESCRIPTION COVERAGE STATUS DECISION DATE FAC [facility] Coverage determination is reflected for the facility admission and is not a guarantee of payment for ongoing services. Covered/Approved 10/06/2019 1 [id number] Correction, hammertoe (eg, interphalange more Covered/Approved 10/06/2019 2 [id number] Correction, hammertoe (eg, interphalange more Covered/Approved 10/06/2019

## 2019-10-17 ENCOUNTER — Encounter: Payer: Self-pay | Admitting: Podiatry

## 2019-10-17 ENCOUNTER — Other Ambulatory Visit: Payer: Self-pay | Admitting: Podiatry

## 2019-10-17 DIAGNOSIS — M2042 Other hammer toe(s) (acquired), left foot: Secondary | ICD-10-CM

## 2019-10-17 DIAGNOSIS — M2041 Other hammer toe(s) (acquired), right foot: Secondary | ICD-10-CM

## 2019-10-17 MED ORDER — HYDROCODONE-ACETAMINOPHEN 5-325 MG PO TABS: 1.0000 | ORAL_TABLET | Freq: Four times a day (QID) | ORAL | 0 refills | Status: AC | PRN

## 2019-10-17 MED ORDER — CEPHALEXIN 500 MG PO CAPS: 500.0000 mg | ORAL_CAPSULE | Freq: Three times a day (TID) | ORAL | 0 refills | Status: AC

## 2019-10-17 MED ORDER — ONDANSETRON HCL 4 MG PO TABS: 4.0000 mg | ORAL_TABLET | Freq: Three times a day (TID) | ORAL | 0 refills | Status: AC | PRN

## 2019-10-17 NOTE — Progress Notes (Signed)
Post-op medications sent to the pharmacy.  

## 2019-10-18 ENCOUNTER — Telehealth: Payer: Self-pay | Admitting: *Deleted

## 2019-10-18 NOTE — Telephone Encounter (Signed)
Called and left a message for the patient to call the office if any concerns or questions and can also go thru my chart and leave Dr Ardelle Anton a message. Misty Stanley

## 2019-10-22 ENCOUNTER — Other Ambulatory Visit: Payer: Self-pay

## 2019-10-22 ENCOUNTER — Ambulatory Visit (INDEPENDENT_AMBULATORY_CARE_PROVIDER_SITE_OTHER): Payer: 59 | Admitting: Podiatry

## 2019-10-22 ENCOUNTER — Telehealth: Payer: Self-pay | Admitting: Podiatry

## 2019-10-22 ENCOUNTER — Encounter: Payer: Self-pay | Admitting: Podiatry

## 2019-10-22 ENCOUNTER — Ambulatory Visit (INDEPENDENT_AMBULATORY_CARE_PROVIDER_SITE_OTHER): Payer: 59

## 2019-10-22 VITALS — BP 92/58 | HR 90 | Temp 98.1°F | Resp 14

## 2019-10-22 DIAGNOSIS — M2041 Other hammer toe(s) (acquired), right foot: Secondary | ICD-10-CM

## 2019-10-22 DIAGNOSIS — M2042 Other hammer toe(s) (acquired), left foot: Secondary | ICD-10-CM | POA: Diagnosis not present

## 2019-10-22 DIAGNOSIS — Z9889 Other specified postprocedural states: Secondary | ICD-10-CM

## 2019-10-22 NOTE — Telephone Encounter (Signed)
I had sx with Dr. Ardelle Anton on Wednesday. I need proof in writing the instructions that were given, not to walk or stand for long periods of time for two weeks. I'm on leave of absence from work. I just need it in proof in writing. If you could get that to me today, that would be ideal, but no rush. Thank you.

## 2019-10-24 DIAGNOSIS — M2042 Other hammer toe(s) (acquired), left foot: Secondary | ICD-10-CM | POA: Insufficient documentation

## 2019-10-24 DIAGNOSIS — M2041 Other hammer toe(s) (acquired), right foot: Secondary | ICD-10-CM | POA: Insufficient documentation

## 2019-10-24 NOTE — Progress Notes (Signed)
Subjective: Lisa Frank is a 24 y.o. is seen today in office s/p bilateral 2nd toe PIPJ arthroplasty preformed on 10/17/2019. States that the pain has been controlled and she has not taken any pain medication. She has been wearing surgical shoes. Denies any systemic complaints such as fevers, chills, nausea, vomiting. No calf pain, chest pain, shortness of breath.   Objective: General: No acute distress, AAOx3  DP/PT pulses palpable 2/4, CRT < 3 sec to all digits.  Protective sensation intact. Motor function intact.  Bilateral foot: Incision is well coapted without any evidence of dehiscence with sutures intact. There is no surrounding erythema, ascending cellulitis, fluctuance, crepitus, malodor, drainage/purulence. There is mild edema around the surgical site. There is no pain along the surgical site. Toes are in a rectus position. No other areas of tenderness to bilateral lower extremities.  No other open lesions or pre-ulcerative lesions.  No pain with calf compression, swelling, warmth, erythema.   Assessment and Plan:  Status post 2nd PIPJ arthroplasty, doing well with no complications   -Treatment options discussed including all alternatives, risks, and complications -X-rays were obtained and reviewed with the patient. Status post PIPJ arthroplasty. No evidence of acute fracture.  -Antibiotic ointment applied followed by a DSD. Can leave the bandage clean, dry, intact.  -Ice/elevation -Pain medication as needed. -Remain in surgical shoes. -Monitor for any clinical signs or symptoms of infection and DVT/PE and directed to call the office immediately should any occur or go to the ER. -Follow-up as scheduled for possible suture removal or sooner if any problems arise. In the meantime, encouraged to call the office with any questions, concerns, change in symptoms.   Ovid Curd, DPM

## 2019-11-01 ENCOUNTER — Other Ambulatory Visit: Payer: Self-pay

## 2019-11-01 ENCOUNTER — Ambulatory Visit (INDEPENDENT_AMBULATORY_CARE_PROVIDER_SITE_OTHER): Payer: 59 | Admitting: Podiatry

## 2019-11-01 DIAGNOSIS — M2042 Other hammer toe(s) (acquired), left foot: Secondary | ICD-10-CM

## 2019-11-01 DIAGNOSIS — Z9889 Other specified postprocedural states: Secondary | ICD-10-CM

## 2019-11-01 DIAGNOSIS — M2041 Other hammer toe(s) (acquired), right foot: Secondary | ICD-10-CM

## 2019-11-01 NOTE — Progress Notes (Signed)
Subjective: Lisa Frank is a 24 y.o. is seen today in office s/p bilateral 2nd toe PIPJ arthroplasty preformed on 10/17/2019.  Presents today for possible suture removal.  Otherwise she has been doing well she is not taking any medication and her feet feel good.  She is doing the surgical shoes. Denies any systemic complaints such as fevers, chills, nausea, vomiting. No calf pain, chest pain, shortness of breath.   Objective: General: No acute distress, AAOx3  DP/PT pulses palpable 2/4, CRT < 3 sec to all digits.  Protective sensation intact. Motor function intact.  Bilateral foot: Incision is well coapted without any evidence of dehiscence with sutures intact.  There is minimal edema there is no erythema or warmth.  No drainage or pus or ascending cellulitis.  The toes are in rectus position. No other open lesions or pre-ulcerative lesions.  No pain with calf compression, swelling, warmth, erythema.   Assessment and Plan:  Status post 2nd PIPJ arthroplasty, doing well with no complications   -Treatment options discussed including all alternatives, risks, and complications -There was motion across the incision so I left the sutures intact as precaution. Antibiotic ointment and a bandage applied.  -Remain in surgical shoe. -Ice/elevation -We will plan on removing sutures next appointment.  Vivi Barrack DPM

## 2019-11-08 ENCOUNTER — Ambulatory Visit (INDEPENDENT_AMBULATORY_CARE_PROVIDER_SITE_OTHER): Payer: 59 | Admitting: Podiatry

## 2019-11-08 ENCOUNTER — Other Ambulatory Visit: Payer: Self-pay

## 2019-11-08 DIAGNOSIS — M2041 Other hammer toe(s) (acquired), right foot: Secondary | ICD-10-CM

## 2019-11-08 DIAGNOSIS — M2042 Other hammer toe(s) (acquired), left foot: Secondary | ICD-10-CM

## 2019-11-08 DIAGNOSIS — Z9889 Other specified postprocedural states: Secondary | ICD-10-CM

## 2019-11-14 NOTE — Progress Notes (Signed)
Subjective: Lisa Frank is a 24 y.o. is seen today in office s/p bilateral 2nd toe PIPJ arthroplasty preformed on 10/17/2019. She presents today for suture removal. She is doing well and not having any pain. She is wearing a regular, supportive sandal today. Denies any systemic complaints such as fevers, chills, nausea, vomiting. No calf pain, chest pain, shortness of breath.   Objective: General: No acute distress, AAOx3  DP/PT pulses palpable 2/4, CRT < 3 sec to all digits.  Protective sensation intact. Motor function intact.  Bilateral foot: Incision is well coapted without any evidence of dehiscence with sutures intact.  There is trace edema there is no erythema or warmth.  No drainage or pus or ascending cellulitis.  The toes are in rectus position. No other open lesions or pre-ulcerative lesions.  No pain with calf compression, swelling, warmth, erythema.   Assessment and Plan:  Status post 2nd PIPJ arthroplasty, doing well with no complications   -Treatment options discussed including all alternatives, risks, and complications -Sutures removed today. Incision well coapted. Antibiotic ointment and bandage applied. She can remove this tomorrow and start to shower. Dry well and apply a small amount of antibiotic ointment and bandage. -Recommended to stay in surgical shoe for 1 month postop and then transition back into a regular shoe.  -Ice/elevation -If she feels able can return to work as scheduled. If she is not able to wear a regular shoe at that time we may need to extend this.  Return in about 6 weeks (around 12/20/2019).  Vivi Barrack DPM

## 2019-11-15 ENCOUNTER — Other Ambulatory Visit: Payer: Self-pay

## 2019-11-15 ENCOUNTER — Ambulatory Visit (INDEPENDENT_AMBULATORY_CARE_PROVIDER_SITE_OTHER): Payer: 59 | Admitting: Podiatry

## 2019-11-15 DIAGNOSIS — Z9889 Other specified postprocedural states: Secondary | ICD-10-CM

## 2019-11-15 DIAGNOSIS — M2042 Other hammer toe(s) (acquired), left foot: Secondary | ICD-10-CM

## 2019-11-15 DIAGNOSIS — M2041 Other hammer toe(s) (acquired), right foot: Secondary | ICD-10-CM

## 2019-11-22 NOTE — Progress Notes (Signed)
Subjective: Lisa Frank is a 24 y.o. is seen today in office s/p bilateral 2nd toe PIPJ arthroplasty preformed on 10/17/2019.  States that she is doing better and she feels fine.  She is having no issues and she is wearing regular shoes today.  Denies any systemic complaints such as fevers, chills, nausea, vomiting. No calf pain, chest pain, shortness of breath.   Objective: General: No acute distress, AAOx3  DP/PT pulses palpable 2/4, CRT < 3 sec to all digits.  Protective sensation intact. Motor function intact.  Bilateral foot: Incision is well coapted without any evidence of dehiscence and scars are formed.  There is trace edema there is no erythema or warmth.  No drainage or pus or ascending cellulitis.  The toes are in rectus position. No other open lesions or pre-ulcerative lesions.  No pain with calf compression, swelling, warmth, erythema.   Assessment and Plan:  Status post 2nd PIPJ arthroplasty, doing well with no complications   -Treatment options discussed including all alternatives, risks, and complications -She stated she is doing well not having any issues.  I recommended cocoa butter or vitamin E oil to the scar to help with the scar tissue. -Continue regular shoe gear.  We we will plan on returning to work schedule.  Not able to do so to let me know.  I will see her back in 6 weeks but she states "I think I am fine".   Return in about 6 weeks (around 12/27/2019), or if symptoms worsen or fail to improve.  Vivi Barrack DPM

## 2019-12-27 ENCOUNTER — Encounter: Payer: 59 | Admitting: Podiatry
